# Patient Record
Sex: Female | Born: 1990
Health system: Southern US, Community
[De-identification: ages and names within clinical notes are randomized; demographics above are authoritative.]

## PROBLEM LIST (undated history)

## (undated) DIAGNOSIS — J45909 Unspecified asthma, uncomplicated: Secondary | ICD-10-CM

## (undated) HISTORY — DX: Unspecified asthma, uncomplicated: J45.909

---
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
## 2015-06-18 ENCOUNTER — Ambulatory Visit (INDEPENDENT_AMBULATORY_CARE_PROVIDER_SITE_OTHER): Payer: BLUE CROSS/BLUE SHIELD | Admitting: Family Medicine

## 2015-06-18 ENCOUNTER — Encounter (INDEPENDENT_AMBULATORY_CARE_PROVIDER_SITE_OTHER): Payer: Self-pay

## 2015-06-18 ENCOUNTER — Encounter: Payer: Self-pay | Admitting: Family Medicine

## 2015-06-18 VITALS — BP 131/90 | HR 91 | Temp 98.5°F | Ht 63.0 in | Wt 214.6 lb

## 2015-06-18 DIAGNOSIS — S39012A Strain of muscle, fascia and tendon of lower back, initial encounter: Secondary | ICD-10-CM | POA: Diagnosis not present

## 2015-06-18 MED ORDER — MELOXICAM 15 MG PO TABS: 15.0000 mg | ORAL_TABLET | Freq: Every day | ORAL | Status: DC

## 2015-06-18 NOTE — Progress Notes (Signed)
BP 131/90 mmHg  Pulse 91  Temp(Src) 98.5 F (36.9 C) (Oral)  Ht  (1.6 m)  Wt 214 lb 9.6 oz (97.342 kg)  BMI 38.02 kg/m2   Subjective:    Patient ID: Amanda Lam, female    DOB: 1991/02/08, 25 y.o.   MRN: [id number]  HPI: Amanda Lam is a 25 y.o. female presenting on 06/18/2015 for Back Pain   HPI Back pain Patient comes in today to establish with [place] as a new patient. She is also coming in because she is been having some back pain. Back pain is described as a tightness and pain that is going from her muscles and vitamin down to her mid back. She denies any numbness or weakness going down either arm or leg. 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  Relevant past medical, surgical, family and social history reviewed and updated as indicated. Interim medical history since our last visit reviewed. Allergies and medications reviewed and updated.  Review of Systems  Constitutional: Negative for fever and chills.  HENT: Negative for congestion, ear discharge and ear pain.   Eyes: Negative for redness and visual disturbance.  Respiratory: Negative for chest tightness and shortness of breath.   Cardiovascular: Negative for chest pain and leg swelling.  Genitourinary: Negative for dysuria and difficulty urinating.  Musculoskeletal: Positive for myalgias and back pain. Negative for gait problem.  Skin: Negative for rash.  Neurological: Negative for light-headedness and headaches.  Psychiatric/Behavioral: Negative for behavioral problems and agitation.  All other systems reviewed and are negative.   Per HPI unless specifically indicated above     Medication List       This list is accurate as of: 06/18/15 10:44 AM.  Always use your most recent med list.               cyclobenzaprine 5 MG tablet  Commonly known as:  FLEXERIL  TAKE 1-2 TABLETS BY MOUTH EVERY 8 HOURS AS  NEEDED     meloxicam 15 MG tablet  Commonly known as:  MOBIC  Take 1 tablet (15 mg total) by mouth daily.           Objective:    BP 131/90 mmHg  Pulse 91  Temp(Src) 98.5 F (36.9 C) (Oral)  Ht  (1.6 m)  Wt 214 lb 9.6 oz (97.342 kg)  BMI 38.02 kg/m2  Wt Readings from Last 3 Encounters:  06/18/15 214 lb 9.6 oz (97.342 kg)    Physical Exam  Constitutional: She is oriented to person, place, and time. She appears well-developed and well-nourished. No distress.  Eyes: Conjunctivae and EOM are normal. Pupils are equal, round, and reactive to light.  Neck: Neck supple. No thyromegaly present.  Cardiovascular: Normal rate, regular rhythm, normal heart sounds and intact distal pulses.   No murmur heard. Pulmonary/Chest: Effort normal and breath sounds normal. No respiratory distress. She has no wheezes.  Musculoskeletal: Normal range of motion. She exhibits no edema.       Lumbar back: She exhibits tenderness (Paraspinal tenderness extending from lower cervical down to mid thoracic), pain and spasm. She exhibits normal range of motion, no bony tenderness, no swelling and no laceration.  Lymphadenopathy:    She has no cervical adenopathy.  Neurological: She is alert and oriented to person, place, and time. Coordination normal.  Skin: Skin is warm and dry. No rash noted.  She is not diaphoretic.  Psychiatric: She has a normal mood and affect. Her behavior is normal.  Nursing note and vitals reviewed.   No results found for this or any previous visit.    Assessment & Plan:   Problem List Items Addressed This Visit    None    Visit Diagnoses    Back strain, initial encounter    -  Primary    Continue Flexeril, uses Mobic, use stretching and tennis ball to massage out the tight muscles    Relevant Medications    meloxicam (MOBIC) 15 MG tablet        Follow up plan: Return in about 4 weeks (around 07/16/2015), or if symptoms worsen or fail to improve, for  WWE/PAP.  Counseling provided for all of the vaccine components No orders of the defined types were placed in this encounter.    Arville CareJoshua Venancio Chenier, MD [facility] 06/18/2015, 10:44 AM

## 2015-07-15 ENCOUNTER — Other Ambulatory Visit: Payer: Self-pay | Admitting: Family Medicine

## 2015-07-28 ENCOUNTER — Encounter: Payer: Self-pay | Admitting: Nurse Practitioner

## 2015-07-28 ENCOUNTER — Ambulatory Visit (INDEPENDENT_AMBULATORY_CARE_PROVIDER_SITE_OTHER): Payer: BLUE CROSS/BLUE SHIELD | Admitting: Nurse Practitioner

## 2015-07-28 VITALS — BP 126/88 | HR 116 | Temp 98.1°F | Ht 63.0 in | Wt 216.0 lb

## 2015-07-28 DIAGNOSIS — Z23 Encounter for immunization: Secondary | ICD-10-CM | POA: Diagnosis not present

## 2015-07-28 DIAGNOSIS — Z0000 Encounter for general adult medical examination without abnormal findings: Secondary | ICD-10-CM | POA: Diagnosis not present

## 2015-07-28 DIAGNOSIS — Z01419 Encounter for gynecological examination (general) (routine) without abnormal findings: Secondary | ICD-10-CM

## 2015-07-28 LAB — URINALYSIS, COMPLETE
BILIRUBIN UA: NEGATIVE
Glucose, UA: NEGATIVE
Ketones, UA: NEGATIVE
Leukocytes, UA: NEGATIVE
Nitrite, UA: NEGATIVE
PH UA: 6 (ref 5.0–7.5)
Protein, UA: NEGATIVE
RBC UA: NEGATIVE
Specific Gravity, UA: >1.03 — ABNORMAL HIGH (ref 1.005–1.030)
UUROB: 0.2 mg/dL (ref 0.2–1.0)

## 2015-07-28 LAB — MICROSCOPIC EXAMINATION

## 2015-07-28 NOTE — Progress Notes (Signed)
   Subjective:    Patient ID: Amanda Lam, female    DOB: 09/27/1990, 25 y.o.   MRN: [id number]  HPI Patient comes in today for PAP and pelvic exam- this is her first female exam she is not currently  sexually active. LMP- 07/15/15- normal. Patient has no medical problems and is on no meds.    Review of Systems  Constitutional: Negative.   HENT: Negative.   Respiratory: Negative.   Cardiovascular: Negative.   Gastrointestinal: Negative.   Genitourinary: Negative.   Neurological: Negative.   Psychiatric/Behavioral: Negative.   All other systems reviewed and are negative.      Objective:   Physical Exam  Constitutional: She is oriented to person, place, and time. She appears well-developed and well-nourished.  HENT:  Head: Normocephalic.  Right Ear: Hearing, tympanic membrane, external ear and ear canal normal.  Left Ear: Hearing, tympanic membrane, external ear and ear canal normal.  Nose: Nose normal.  Mouth/Throat: Uvula is midline and oropharynx is clear and moist.  Eyes: Conjunctivae and EOM are normal. Pupils are equal, round, and reactive to light.  Neck: Normal range of motion and full passive range of motion without pain. Neck supple. No JVD present. Carotid bruit is not present. No thyroid mass and no thyromegaly present.  Cardiovascular: Normal rate, normal heart sounds and intact distal pulses.   No murmur heard. Pulmonary/Chest: Effort normal and breath sounds normal. Right breast exhibits no inverted nipple, no mass, no nipple discharge, no skin change and no tenderness. Left breast exhibits no inverted nipple, no mass, no nipple discharge, no skin change and no tenderness.  Abdominal: Soft. Bowel sounds are normal. She exhibits no mass. There is no tenderness.  Genitourinary: Vagina normal and uterus normal. No breast swelling, tenderness, discharge or bleeding. No vaginal discharge found.  Cervix nonparous and pink. bimanual exam-No adnexal masses or tenderness.   Musculoskeletal: Normal range of motion.  Lymphadenopathy:    She has no cervical adenopathy.  Neurological: She is alert and oriented to person, place, and time.  Skin: Skin is warm and dry.  Psychiatric: She has a normal mood and affect. Her behavior is normal. Judgment and thought content normal.    BP 126/88 mmHg  Pulse 116  Temp(Src) 98.1 F (36.7 C) (Oral)  Ht '5\' 3"'$  (1.6 m)  Wt 216 lb (97.977 kg)  BMI 38.27 kg/m2      Assessment & Plan:  1. Annual physical exam - Urinalysis, Complete - CBC with Differential/Platelet - BMP8+EGFR - STD Screen (8)  2. Encounter for routine gynecological examination - Pap IG, CT/NG w/ reflex HPV when ASC-U    Labs pending Health maintenance reviewed Diet and exercise encouraged Continue all meds Follow up  In 1 year  [place], FNP

## 2015-07-28 NOTE — Patient Instructions (Signed)
Pap Test WHY AM I HAVING THIS TEST? A pap test is sometimes called a pap smear. It is a screening test that is used to check for signs of cancer of the vagina, cervix, and uterus. The test can also identify the presence of infection or precancerous changes. Your health care provider will likely recommend you have this test done on a regular basis. This test may be done:  Every 3 years, starting at age 25.  Every 5 years, in combination with testing for the presence of human papillomavirus (HPV).  More or less often depending on other medical conditions.  WHAT KIND OF SAMPLE IS TAKEN? Using a small cotton swab, plastic spatula, or brush, your health care provider will collect a sample of cells from the surface of your cervix. Your cervix is the opening to your uterus, also called a womb. Secretions from the cervix and vagina may also be collected. HOW DO I PREPARE FOR THE TEST?  Be aware of where you are in your menstrual cycle. You may be asked to reschedule the test if you are menstruating on the day of the test.  You may need to reschedule if you have a known vaginal infection on the day of the test.  You may be asked to avoid douching or taking a bath the day before or the day of the test.  Some medicines can cause abnormal test results, such as digitalis and tetracycline. Talk with your health care provider before your test if you take one of these medicines. WHAT DO THE RESULTS MEAN? Abnormal test results may indicate a number of health conditions. These may include:  Cancer. Although pap test results cannot be used to diagnose cancer of the cervix, vagina, or uterus, they may suggest the possibility of cancer. Further tests would be required to determine if cancer is present.  Sexually transmitted disease.  Fungal infection.  Parasite infection.  Herpes infection.  A condition causing or contributing to infertility. It is your responsibility to obtain your test results. Ask  the lab or department performing the test when and how you will get your results. Contact your health care provider to discuss any questions you have about your results.   This information is not intended to replace advice given to you by your health care provider. Make sure you discuss any questions you have with your health care provider.   Document Released: 04/30/2002 Document Revised: 02/28/2014 Document Reviewed: 07/01/2013 Elsevier Interactive Patient Education 2016 [employer]  

## 2015-07-28 NOTE — Addendum Note (Signed)
Addended by: Cleda DaubUCKER, Victormanuel Mclure G on: 07/28/2015 04:17 PM   Modules accepted: Orders, SmartSet

## 2015-07-29 LAB — CBC WITH DIFFERENTIAL/PLATELET
BASOS: 1 %
Basophils Absolute: 0.1 10*3/uL (ref 0.0–0.2)
EOS (ABSOLUTE): 0.1 10*3/uL (ref 0.0–0.4)
Eos: 1 %
HEMOGLOBIN: 13.7 g/dL (ref 11.1–15.9)
Hematocrit: 39.8 % (ref 34.0–46.6)
IMMATURE GRANS (ABS): 0 10*3/uL (ref 0.0–0.1)
Immature Granulocytes: 0 %
LYMPHS ABS: 2.4 10*3/uL (ref 0.7–3.1)
LYMPHS: 29 %
MCH: 27.6 pg (ref 26.6–33.0)
MCHC: 34.4 g/dL (ref 31.5–35.7)
MCV: 80 fL (ref 79–97)
MONOCYTES: 5 %
Monocytes Absolute: 0.5 10*3/uL (ref 0.1–0.9)
NEUTROS ABS: 5.4 10*3/uL (ref 1.4–7.0)
Neutrophils: 64 %
Platelets: 363 10*3/uL (ref 150–379)
RBC: 4.97 x10E6/uL (ref 3.77–5.28)
RDW: 14 % (ref 12.3–15.4)
WBC: 8.4 10*3/uL (ref 3.4–10.8)

## 2015-07-29 LAB — BMP8+EGFR
BUN / CREAT RATIO: 20 (ref 9–23)
BUN: 13 mg/dL (ref 6–20)
CO2: 17 mmol/L — ABNORMAL LOW (ref 18–29)
CREATININE: 0.64 mg/dL (ref 0.57–1.00)
Calcium: 9.6 mg/dL (ref 8.7–10.2)
Chloride: 104 mmol/L (ref 96–106)
GFR, EST AFRICAN AMERICAN: 143 mL/min/{1.73_m2} (ref >59)
GFR, EST NON AFRICAN AMERICAN: 124 mL/min/{1.73_m2} (ref >59)
Glucose: 103 mg/dL — ABNORMAL HIGH (ref 65–99)
Potassium: 4.1 mmol/L (ref 3.5–5.2)
Sodium: 144 mmol/L (ref 134–144)

## 2015-07-29 LAB — STD SCREEN (8)
HIV SCREEN 4TH GENERATION: NONREACTIVE
HSV 2 Glycoprotein G Ab, IgG: <0.91 index (ref 0.00–0.90)
Hep A IgM: NEGATIVE
Hep B C IgM: NEGATIVE
Hepatitis B Surface Ag: NEGATIVE
RPR: NONREACTIVE

## 2015-07-30 LAB — PAP IG, CT-NG, RFX HPV ASCU
Chlamydia, Nuc. Acid Amp: NEGATIVE
GONOCOCCUS BY NUCLEIC ACID AMP: NEGATIVE
PAP Smear Comment: 0

## 2015-08-05 ENCOUNTER — Encounter: Payer: Self-pay | Admitting: Family Medicine

## 2015-08-05 ENCOUNTER — Ambulatory Visit (INDEPENDENT_AMBULATORY_CARE_PROVIDER_SITE_OTHER): Payer: BLUE CROSS/BLUE SHIELD | Admitting: Family Medicine

## 2015-08-05 VITALS — BP 134/86 | HR 88 | Temp 98.9°F | Ht 63.0 in | Wt 217.6 lb

## 2015-08-05 DIAGNOSIS — F410 Panic disorder [episodic paroxysmal anxiety] without agoraphobia: Secondary | ICD-10-CM | POA: Insufficient documentation

## 2015-08-05 DIAGNOSIS — R7301 Impaired fasting glucose: Secondary | ICD-10-CM | POA: Diagnosis not present

## 2015-08-05 DIAGNOSIS — F418 Other specified anxiety disorders: Secondary | ICD-10-CM

## 2015-08-05 DIAGNOSIS — F329 Major depressive disorder, single episode, unspecified: Secondary | ICD-10-CM

## 2015-08-05 DIAGNOSIS — F419 Anxiety disorder, unspecified: Secondary | ICD-10-CM

## 2015-08-05 DIAGNOSIS — F32A Depression, unspecified: Secondary | ICD-10-CM

## 2015-08-05 MED ORDER — HYDROXYZINE HCL 50 MG PO TABS: 50.0000 mg | ORAL_TABLET | Freq: Three times a day (TID) | ORAL | Status: DC | PRN

## 2015-08-05 MED ORDER — ESCITALOPRAM OXALATE 10 MG PO TABS: 10.0000 mg | ORAL_TABLET | Freq: Every day | ORAL | Status: DC

## 2015-08-05 NOTE — Progress Notes (Signed)
BP 134/86 mmHg  Pulse 88  Temp(Src) 98.9 F (37.2 C) (Oral)  Ht  (1.6 m)  Wt 217 lb 9.6 oz (98.703 kg)  BMI 38.56 kg/m2   Subjective:    Patient ID: Amanda Lam, female    DOB: Feb 17, 1991, 25 y.o.   MRN: [id number]  HPI: Amanda Lam is a 25 y.o. female presenting on 08/05/2015 for Headache   HPI Flushing and headache and palpitations Over the past week patient has been having almost daily episodes of flushing and headaches and palpitations and just feeling off. Patient has been feeling this for the past week. Her headaches are described as bilateral temporal headaches which then sometimes become more intense and she does sometimes have photophobia associated with them. She will lay down to rest and all of the symptoms will improve. She does admit that she had a history of depression and anxiety and she has been having a very high stress and anxiety level. She is also been feeling depressed sometimes more recently. She denies any suicidal ideations or thoughts of hurting herself. She says she is sleeping well at night and has occasional waking up in the middle of night. She denies any chest pain or shortness of breath.  Relevant past medical, surgical, family and social history reviewed and updated as indicated. Interim medical history since our last visit reviewed. Allergies and medications reviewed and updated.  Review of Systems  Constitutional: Negative for fever and chills.  HENT: Negative for congestion, ear discharge and ear pain.   Eyes: Negative for redness and visual disturbance.  Respiratory: Negative for chest tightness and shortness of breath.   Cardiovascular: Negative for chest pain and leg swelling.  Genitourinary: Negative for dysuria and difficulty urinating.  Musculoskeletal: Negative for back pain and gait problem.  Skin: Negative for rash.  Neurological: Positive for light-headedness and headaches. Negative for dizziness, speech difficulty, weakness and  numbness.  Psychiatric/Behavioral: Positive for sleep disturbance and dysphoric mood. Negative for suicidal ideas, behavioral problems, self-injury and agitation. The patient is nervous/anxious.   All other systems reviewed and are negative.   Per HPI unless specifically indicated above     Medication List       This list is accurate as of: 08/05/15 11:36 AM.  Always use your most recent med list.               cyclobenzaprine 10 MG tablet  Commonly known as:  FLEXERIL  Take 10 mg by mouth as needed for muscle spasms.     escitalopram 10 MG tablet  Commonly known as:  LEXAPRO  Take 1 tablet (10 mg total) by mouth daily.           Objective:    BP 134/86 mmHg  Pulse 88  Temp(Src) 98.9 F (37.2 C) (Oral)  Ht  (1.6 m)  Wt 217 lb 9.6 oz (98.703 kg)  BMI 38.56 kg/m2  Wt Readings from Last 3 Encounters:  08/05/15 217 lb 9.6 oz (98.703 kg)  07/28/15 216 lb (97.977 kg)  06/18/15 214 lb 9.6 oz (97.342 kg)    Physical Exam  Constitutional: She is oriented to person, place, and time. She appears well-developed and well-nourished. No distress.  Eyes: Conjunctivae and EOM are normal. Pupils are equal, round, and reactive to light.  Neck: Neck supple. No thyromegaly present.  Cardiovascular: Normal rate, regular rhythm, normal heart sounds and intact distal pulses.   No murmur heard. Pulmonary/Chest: Effort normal and breath sounds normal.  No respiratory distress. She has no wheezes. She has no rales.  Musculoskeletal: Normal range of motion. She exhibits no edema or tenderness.  Lymphadenopathy:    She has no cervical adenopathy.  Neurological: She is alert and oriented to person, place, and time. Coordination normal.  Skin: Skin is warm and dry. No rash noted. She is not diaphoretic.  Psychiatric: Her behavior is normal. Judgment and thought content normal. Her mood appears anxious. She exhibits a depressed mood. She expresses no suicidal ideation. She expresses no  suicidal plans.  Nursing note and vitals reviewed.     Assessment & Plan:   Problem List Items Addressed This Visit      Other   Anxiety and depression   Relevant Medications   escitalopram (LEXAPRO) 10 MG tablet   hydrOXYzine (ATARAX/VISTARIL) 50 MG tablet   Other Relevant Orders   TSH   Panic attacks   Relevant Medications   escitalopram (LEXAPRO) 10 MG tablet   hydrOXYzine (ATARAX/VISTARIL) 50 MG tablet   Other Relevant Orders   TSH    Other Visit Diagnoses    Elevated fasting glucose    -  Primary    Relevant Orders    Bayer DCA Hb A1c Waived        Follow up plan: Return in about 4 weeks (around 09/02/2015), or if symptoms worsen or fail to improve, for Recheck anxiety and depression and panic attacks.  Counseling provided for all of the vaccine components Orders Placed This Encounter  Procedures  . Bayer DCA Hb A1c Waived  . TSH    Arville CareJoshua Dettinger, MD [facility] 08/05/2015, 11:36 AM

## 2015-08-06 LAB — TSH: TSH: 1.8 u[IU]/mL (ref 0.450–4.500)

## 2015-08-06 LAB — BAYER DCA HB A1C WAIVED: HB A1C: 5.4 % (ref <7.0)

## 2015-08-19 ENCOUNTER — Ambulatory Visit (INDEPENDENT_AMBULATORY_CARE_PROVIDER_SITE_OTHER): Payer: BLUE CROSS/BLUE SHIELD | Admitting: Family Medicine

## 2015-08-19 ENCOUNTER — Encounter: Payer: Self-pay | Admitting: Family Medicine

## 2015-08-19 VITALS — BP 127/82 | HR 70 | Temp 98.6°F | Ht 63.0 in | Wt 215.4 lb

## 2015-08-19 DIAGNOSIS — L918 Other hypertrophic disorders of the skin: Secondary | ICD-10-CM

## 2015-08-19 NOTE — Progress Notes (Signed)
BP 127/82 mmHg  Pulse 70  Temp(Src) 98.6 F (37 C) (Oral)  Ht 5\' 3"  (1.6 m)  Wt 215 lb 6.4 oz (97.705 kg)  BMI 38.17 kg/m2  LMP 08/19/2015 (Exact Date)   Subjective:    Patient ID: Amanda Lam, female    DOB: December 23, 1990, 25 y.o.   MRN: [id number]  HPI: Amanda Lam is a 25 y.o. female presenting on 08/19/2015 for Skin tag removal   HPI Skin tag left neck Patient has an irritated skin tag on the left side of her neck near her shirt line. It has been increasing in size over the past few years and has been more and more irritated. Sometimes she will snag it and will bleed as well. She denies any drainage or redness or warmth from the site.  Relevant past medical, surgical, family and social history reviewed and updated as indicated. Interim medical history since our last visit reviewed. Allergies and medications reviewed and updated.  Review of Systems  Constitutional: Negative for fever and chills.  HENT: Negative for congestion, ear discharge and ear pain.   Eyes: Negative for redness and visual disturbance.  Respiratory: Negative for chest tightness and shortness of breath.   Cardiovascular: Negative for chest pain and leg swelling.  Genitourinary: Negative for dysuria and difficulty urinating.  Musculoskeletal: Negative for back pain and gait problem.  Skin: Negative for rash.  Neurological: Negative for light-headedness and headaches.  Psychiatric/Behavioral: Negative for behavioral problems and agitation.  All other systems reviewed and are negative.   Per HPI unless specifically indicated above     Medication List       This list is accurate as of: 08/19/15  4:30 PM.  Always use your most recent med list.               cyclobenzaprine 10 MG tablet  Commonly known as:  FLEXERIL  Take 10 mg by mouth as needed for muscle spasms. Reported on 08/19/2015     escitalopram 10 MG tablet  Commonly known as:  LEXAPRO  Take 1 tablet (10 mg total) by mouth daily.     hydrOXYzine 50 MG tablet  Commonly known as:  ATARAX/VISTARIL  Take 1 tablet (50 mg total) by mouth 3 (three) times daily as needed.           Objective:    BP 127/82 mmHg  Pulse 70  Temp(Src) 98.6 F (37 C) (Oral)  Ht 5\' 3"  (1.6 m)  Wt 215 lb 6.4 oz (97.705 kg)  BMI 38.17 kg/m2  LMP 08/19/2015 (Exact Date)  Wt Readings from Last 3 Encounters:  08/19/15 215 lb 6.4 oz (97.705 kg)  08/05/15 217 lb 9.6 oz (98.703 kg)  07/28/15 216 lb (97.977 kg)    Physical Exam  Constitutional: She is oriented to person, place, and time. She appears well-developed and well-nourished. No distress.  Eyes: Conjunctivae and EOM are normal. Pupils are equal, round, and reactive to light.  Cardiovascular: Normal rate, regular rhythm, normal heart sounds and intact distal pulses.   No murmur heard. Pulmonary/Chest: Effort normal and breath sounds normal. No respiratory distress. She has no wheezes.  Musculoskeletal: Normal range of motion. She exhibits no edema or tenderness.  Neurological: She is alert and oriented to person, place, and time. Coordination normal.  Skin: Skin is warm and dry. Lesion (0.1 cm skin tag on left base of her neck. No signs of erythema or warmth or drainage.) noted. No rash noted. She is not diaphoretic.  Psychiatric: She has a normal mood and affect. Her behavior is normal.  Nursing note and vitals reviewed.   Skin tag removal: Topical Betadine was used for prep. Forceps were used to elevate and iris scissors used to cut at the stalk. Minimal to no bleeding. Patient tolerated well. Applied simple bandage after    Assessment & Plan:   Problem List Items Addressed This Visit    None    Visit Diagnoses    Skin tag    -  Primary    Left neck, irritated skin tag because of shirt line. Removed without incident        Follow up plan: Return if symptoms worsen or fail to improve.  Counseling provided for all of the vaccine components No orders of the defined types  were placed in this encounter.    Arville CareJoshua Julietta Batterman, MD [facility] 08/19/2015, 4:30 PM

## 2015-09-02 ENCOUNTER — Encounter: Payer: Self-pay | Admitting: Family Medicine

## 2015-09-02 ENCOUNTER — Ambulatory Visit (INDEPENDENT_AMBULATORY_CARE_PROVIDER_SITE_OTHER): Payer: BLUE CROSS/BLUE SHIELD | Admitting: Family Medicine

## 2015-09-02 VITALS — BP 121/82 | HR 77 | Temp 98.1°F | Ht 63.0 in | Wt 213.0 lb

## 2015-09-02 DIAGNOSIS — F419 Anxiety disorder, unspecified: Principal | ICD-10-CM

## 2015-09-02 DIAGNOSIS — F410 Panic disorder [episodic paroxysmal anxiety] without agoraphobia: Secondary | ICD-10-CM | POA: Diagnosis not present

## 2015-09-02 DIAGNOSIS — F418 Other specified anxiety disorders: Secondary | ICD-10-CM | POA: Diagnosis not present

## 2015-09-02 DIAGNOSIS — F32A Depression, unspecified: Secondary | ICD-10-CM

## 2015-09-02 DIAGNOSIS — F329 Major depressive disorder, single episode, unspecified: Secondary | ICD-10-CM

## 2015-09-02 MED ORDER — ESCITALOPRAM OXALATE 10 MG PO TABS: 10.0000 mg | ORAL_TABLET | Freq: Every day | ORAL | Status: DC

## 2015-09-02 NOTE — Progress Notes (Signed)
BP 121/82 mmHg  Pulse 77  Temp(Src) 98.1 F (36.7 C) (Oral)  Ht 5\' 3"  (1.6 m)  Wt 213 lb (96.616 kg)  BMI 37.74 kg/m2  LMP 08/19/2015 (Exact Date)   Subjective:    Patient ID: Amanda Lam, female    DOB: May 31, 1990, 25 y.o.   MRN: [id number]  HPI: Amanda Lam is a 25 y.o. female presenting on 09/02/2015 for Depression   HPI Anxiety and depression and panic attacks Patient feels like she is doing very well on her medications and is very rarely having panic attacks now and when she does have a mammogram or controllable. She is going through a breakup recently so she is having a lot of sadness from that but not anything more than the normal breakup at this point. She denies any suicidal ideations or thoughts of hurting herself.  Relevant past medical, surgical, family and social history reviewed and updated as indicated. Interim medical history since our last visit reviewed. Allergies and medications reviewed and updated.  Review of Systems  Constitutional: Negative for fever and chills.  HENT: Negative for congestion, ear discharge and ear pain.   Eyes: Negative for redness and visual disturbance.  Respiratory: Negative for chest tightness and shortness of breath.   Cardiovascular: Negative for chest pain and leg swelling.  Genitourinary: Negative for dysuria and difficulty urinating.  Musculoskeletal: Negative for back pain and gait problem.  Skin: Negative for rash.  Neurological: Negative for dizziness, light-headedness and headaches.  Psychiatric/Behavioral: Positive for dysphoric mood. Negative for suicidal ideas, behavioral problems, sleep disturbance, self-injury and agitation. The patient is nervous/anxious.   All other systems reviewed and are negative.   Per HPI unless specifically indicated above     Medication List       This list is accurate as of: 09/02/15  1:31 PM.  Always use your most recent med list.               cyclobenzaprine 10 MG tablet    Commonly known as:  FLEXERIL  Take 10 mg by mouth as needed for muscle spasms. Reported on 09/02/2015     escitalopram 10 MG tablet  Commonly known as:  LEXAPRO  Take 1 tablet (10 mg total) by mouth daily.     hydrOXYzine 50 MG tablet  Commonly known as:  ATARAX/VISTARIL  Take 1 tablet (50 mg total) by mouth 3 (three) times daily as needed.           Objective:    BP 121/82 mmHg  Pulse 77  Temp(Src) 98.1 F (36.7 C) (Oral)  Ht 5\' 3"  (1.6 m)  Wt 213 lb (96.616 kg)  BMI 37.74 kg/m2  LMP 08/19/2015 (Exact Date)  Wt Readings from Last 3 Encounters:  09/02/15 213 lb (96.616 kg)  08/19/15 215 lb 6.4 oz (97.705 kg)  08/05/15 217 lb 9.6 oz (98.703 kg)    Physical Exam  Constitutional: She is oriented to person, place, and time. She appears well-developed and well-nourished. No distress.  Eyes: Conjunctivae and EOM are normal. Pupils are equal, round, and reactive to light.  Neck: Neck supple. No thyromegaly present.  Cardiovascular: Normal rate, regular rhythm, normal heart sounds and intact distal pulses.   No murmur heard. Pulmonary/Chest: Effort normal and breath sounds normal. No respiratory distress. She has no wheezes.  Musculoskeletal: Normal range of motion. She exhibits no edema or tenderness.  Lymphadenopathy:    She has no cervical adenopathy.  Neurological: She is alert and oriented to  person, place, and time. Coordination normal.  Skin: Skin is warm and dry. No rash noted. She is not diaphoretic.  Psychiatric: Her behavior is normal. Thought content normal. Her mood appears anxious. She exhibits a depressed mood. She expresses no suicidal ideation. She expresses no suicidal plans.  Nursing note and vitals reviewed.   Results for orders placed or performed in visit on 08/05/15  Bayer DCA Hb A1c Waived  Result Value Ref Range   Bayer DCA Hb A1c Waived 5.4 <7.0 %  TSH  Result Value Ref Range   TSH 1.800 0.450 - 4.500 uIU/mL      Assessment & Plan:        Problem List Items Addressed This Visit      Other   Anxiety and depression - Primary   Relevant Medications   escitalopram (LEXAPRO) 10 MG tablet   Panic attacks   Relevant Medications   escitalopram (LEXAPRO) 10 MG tablet       Follow up plan: Return in about 3 months (around 12/03/2015), or if symptoms worsen or fail to improve, for Anxiety recheck.  Counseling provided for all of the vaccine components No orders of the defined types were placed in this encounter.    Arville Care, MD [facility] 09/02/2015, 1:31 PM

## 2015-09-24 ENCOUNTER — Encounter: Payer: Self-pay | Admitting: *Deleted

## 2015-10-19 ENCOUNTER — Telehealth: Payer: Self-pay | Admitting: Family Medicine

## 2015-10-19 NOTE — Telephone Encounter (Signed)
Appt made

## 2015-10-21 ENCOUNTER — Ambulatory Visit: Payer: BLUE CROSS/BLUE SHIELD | Admitting: Family Medicine

## 2015-10-22 ENCOUNTER — Encounter: Payer: Self-pay | Admitting: Family Medicine

## 2015-12-03 ENCOUNTER — Ambulatory Visit: Payer: BLUE CROSS/BLUE SHIELD | Admitting: Family Medicine

## 2015-12-04 ENCOUNTER — Encounter: Payer: Self-pay | Admitting: Family Medicine

## 2016-02-26 ENCOUNTER — Ambulatory Visit: Payer: BLUE CROSS/BLUE SHIELD | Admitting: Family Medicine

## 2016-02-26 ENCOUNTER — Ambulatory Visit (INDEPENDENT_AMBULATORY_CARE_PROVIDER_SITE_OTHER): Payer: BLUE CROSS/BLUE SHIELD | Admitting: Family Medicine

## 2016-02-26 ENCOUNTER — Encounter: Payer: Self-pay | Admitting: Family Medicine

## 2016-02-26 VITALS — BP 126/78 | HR 88 | Temp 98.2°F | Ht 63.0 in | Wt 222.0 lb

## 2016-02-26 DIAGNOSIS — G5603 Carpal tunnel syndrome, bilateral upper limbs: Secondary | ICD-10-CM | POA: Diagnosis not present

## 2016-02-26 DIAGNOSIS — S39012A Strain of muscle, fascia and tendon of lower back, initial encounter: Secondary | ICD-10-CM

## 2016-02-26 MED ORDER — CYCLOBENZAPRINE HCL 10 MG PO TABS: 10.0000 mg | ORAL_TABLET | ORAL | 1 refills | Status: DC | PRN

## 2016-02-26 NOTE — Progress Notes (Signed)
BP 126/78   Pulse 88   Temp 98.2 F (36.8 C) (Oral)   Ht 5\' 3"  (1.6 m)   Wt 222 lb (100.7 kg)   LMP 02/12/2016 (Approximate)   BMI 39.33 kg/m    Subjective:    Patient ID: Amanda Lam, female    DOB: [birth year], 26 y.o.   MRN: [id number]  HPI: Amanda Lam is a 26 y.o. female presenting on 02/26/2016 for Back Pain (in between shoulder blades x 2-3 weeks, taking Aleve and using muscle rub) and Burning, numbness and tingling in right hand (started after she began lifting heavy cans of cotton at work)   HPI Back pain Patient is coming in today for back pain up in the thorax region and she does a lot worse because she's been working a job where she has to do a lot of lifting and twisting and it just feels tight. She says the low-grade tightness not a sharp pain that goes or fevers or chills or overlying skin changes. between her scapula on her upper back. She denies any numbness or weakness going down either arm and denies any pain going up or down her back. She does have numbness in both hands because she's doing a lot of movement with her hands but not in the arms. This is been increased over the past 2 weeks  Bilateral hand numbness and tingling Patient has bilateral hand numbness and tingling and cannot pinpoint wounds on the medial side or lateral side over the whole hand but says that she gets it and will shoot up to her hands especially when she is doing a lot of lifting and moving and twisting and movements with her hands. She denies any grip loss or fevers or chills or overlying skin changes. This is been increased over the past 2 weeks  Relevant past medical, surgical, family and social history reviewed and updated as indicated. Interim medical history since our last visit reviewed. Allergies and medications reviewed and updated.  Review of Systems  Constitutional: Negative for chills and fever.  Respiratory: Negative for chest tightness and shortness of breath.     Cardiovascular: Negative for chest pain and leg swelling.  Genitourinary: Negative for difficulty urinating and dysuria.  Musculoskeletal: Positive for back pain and myalgias. Negative for gait problem.  Skin: Negative for rash.  Neurological: Positive for numbness. Negative for weakness, light-headedness and headaches.  Psychiatric/Behavioral: Negative for agitation and behavioral problems.  All other systems reviewed and are negative.   Per HPI unless specifically indicated above     Objective:    BP 126/78   Pulse 88   Temp 98.2 F (36.8 C) (Oral)   Ht 5\' 3"  (1.6 m)   Wt 222 lb (100.7 kg)   LMP 02/12/2016 (Approximate)   BMI 39.33 kg/m   Wt Readings from Last 3 Encounters:  02/26/16 222 lb (100.7 kg)  09/02/15 213 lb (96.6 kg)  08/19/15 215 lb 6.4 oz (97.7 kg)    Physical Exam  Constitutional: She is oriented to person, place, and time. She appears well-developed and well-nourished. No distress.  Eyes: Conjunctivae are normal.  Cardiovascular: Normal rate, regular rhythm, normal heart sounds and intact distal pulses.   No murmur heard. Pulmonary/Chest: Effort normal and breath sounds normal. No respiratory distress. She has no wheezes.  Musculoskeletal: Normal range of motion. She exhibits no edema. Tenderness: Unable to elicit any tenderness on exam in her upper back.       Right wrist: She  exhibits tenderness (Positive Tinel sign causing numbness to shoot into her hand).       Left wrist: She exhibits tenderness (Positive Tinel sign causing numbness to shoot into her hand).  Neurological: She is alert and oriented to person, place, and time. Coordination normal.  Skin: Skin is warm and dry. No rash noted. She is not diaphoretic.  Psychiatric: She has a normal mood and affect. Her behavior is normal.  Nursing note and vitals reviewed.     Assessment & Plan:   Problem List Items Addressed This Visit    None    Visit Diagnoses    Bilateral carpal tunnel syndrome     -  Primary   Patient wants to continue trying the bracing and anti-inflammatories for now, may need injections in the future but does not want one today   Relevant Medications   cyclobenzaprine (FLEXERIL) 10 MG tablet   Back strain, initial encounter       Patient does a lot of lifting for work and is just been feeling some more sore over the past couple weeks, will give Flexeril and recommended stretching   Relevant Medications   cyclobenzaprine (FLEXERIL) 10 MG tablet       Follow up plan: Return if symptoms worsen or fail to improve.  Counseling provided for all of the vaccine components No orders of the defined types were placed in this encounter.   Arville Care, MD [facility] 02/26/2016, 10:58 AM

## 2016-06-29 DIAGNOSIS — R51 Headache: Secondary | ICD-10-CM | POA: Diagnosis not present

## 2018-04-23 ENCOUNTER — Ambulatory Visit: Payer: BLUE CROSS/BLUE SHIELD | Admitting: Family Medicine

## 2018-04-26 ENCOUNTER — Encounter: Payer: Self-pay | Admitting: Family Medicine

## 2018-05-04 ENCOUNTER — Encounter: Payer: BLUE CROSS/BLUE SHIELD | Admitting: Family Medicine

## 2018-05-24 ENCOUNTER — Other Ambulatory Visit: Payer: Self-pay

## 2018-05-24 ENCOUNTER — Ambulatory Visit: Payer: BLUE CROSS/BLUE SHIELD | Admitting: Family Medicine

## 2018-05-24 ENCOUNTER — Ambulatory Visit (INDEPENDENT_AMBULATORY_CARE_PROVIDER_SITE_OTHER): Payer: BLUE CROSS/BLUE SHIELD

## 2018-05-24 ENCOUNTER — Encounter: Payer: Self-pay | Admitting: Family Medicine

## 2018-05-24 VITALS — BP 123/74 | HR 80 | Temp 97.2°F | Ht 63.0 in | Wt 198.2 lb

## 2018-05-24 DIAGNOSIS — M5442 Lumbago with sciatica, left side: Secondary | ICD-10-CM

## 2018-05-24 DIAGNOSIS — R109 Unspecified abdominal pain: Secondary | ICD-10-CM

## 2018-05-24 DIAGNOSIS — K219 Gastro-esophageal reflux disease without esophagitis: Secondary | ICD-10-CM | POA: Insufficient documentation

## 2018-05-24 DIAGNOSIS — R3129 Other microscopic hematuria: Secondary | ICD-10-CM

## 2018-05-24 DIAGNOSIS — M5441 Lumbago with sciatica, right side: Secondary | ICD-10-CM

## 2018-05-24 DIAGNOSIS — R1030 Lower abdominal pain, unspecified: Secondary | ICD-10-CM | POA: Diagnosis not present

## 2018-05-24 LAB — URINALYSIS, COMPLETE
Bilirubin, UA: NEGATIVE
Glucose, UA: NEGATIVE
Ketones, UA: NEGATIVE
Leukocytes,UA: NEGATIVE
Nitrite, UA: NEGATIVE
Protein,UA: NEGATIVE
Specific Gravity, UA: 1.025 (ref 1.005–1.030)
Urobilinogen, Ur: 0.2 mg/dL (ref 0.2–1.0)
pH, UA: 5.5 (ref 5.0–7.5)

## 2018-05-24 LAB — MICROSCOPIC EXAMINATION: Renal Epithel, UA: NONE SEEN /hpf

## 2018-05-24 LAB — PREGNANCY, URINE: Preg Test, Ur: NEGATIVE

## 2018-05-24 MED ORDER — METHYLPREDNISOLONE ACETATE 40 MG/ML IJ SUSP
40.0000 mg | Freq: Once | INTRAMUSCULAR | Status: AC
  Administered 2018-05-24: 40 mg via INTRAMUSCULAR

## 2018-05-24 MED ORDER — OMEPRAZOLE 20 MG PO CPDR: 20.0000 mg | DELAYED_RELEASE_CAPSULE | Freq: Two times a day (BID) | ORAL | 3 refills | Status: DC

## 2018-05-24 MED ORDER — NAPROXEN 500 MG PO TABS: 500.0000 mg | ORAL_TABLET | Freq: Two times a day (BID) | ORAL | 0 refills | Status: AC

## 2018-05-24 NOTE — Progress Notes (Signed)
Subjective:  Patient ID: Amanda Lam, female    DOB: 07-25-1990, 28 y.o.   MRN: [id number]  Chief Complaint:  Abdominal Pain and Back Pain   HPI: Amanda Lam is a 28 y.o. female presenting on 05/24/2018 for Abdominal Pain and Back Pain  Pt presents today with intermittent epigastric pain and lower abdominal pain. Pt states the epigastric pain has been ongoing for several months, states it is sharp and aching at times. States she can usually control this pain with antiacid chews. States these have not been beneficial over the last week. She states this pain is worse after certain meals and at night, 5/10 at worst. She denies vomiting or diarrhea. She has had nausea and a decrease in bowel movements. She denies rectal bleeding or changes in stool color.  Pt states she also has lower back pain. She reports aching, sharp, shooting midline lower back pain that radiates to both legs. States 6/10 at worst. She states she has not injured herself. No loss of function, weakness, or numbness. No saddle anesthesia or loss of bowel or bladder. States the pain is worse when active and relieved slightly by rest. States she has not tried anything for the pain. She denies urinary or vaginal symptoms. No fever, chills, cough, shortness of breath, chest pain, or fatigue. No recent travel or known sick exposures.   Relevant past medical, surgical, family, and social history reviewed and updated as indicated.  Allergies and medications reviewed and updated.   Past Medical History:  Diagnosis Date  . Asthma     History reviewed. No pertinent surgical history.  Social History   Socioeconomic History  . Marital status: Single    Spouse name: Not on file  . Number of children: Not on file  . Years of education: Not on file  . Highest education level: Not on file  Occupational History  . Not on file  Social Needs  . Financial resource strain: Not on file  . Food insecurity:    Worry: Not on file   Inability: Not on file  . Transportation needs:    Medical: Not on file    Non-medical: Not on file  Tobacco Use  . Smoking status: Former Smoker    Packs/day: 0.50    Years: 7.00    Pack years: 3.50    Types: Cigarettes  . Smokeless tobacco: Never Used  Substance and Sexual Activity  . Alcohol use: No  . Drug use: No  . Sexual activity: Not Currently  Lifestyle  . Physical activity:    Days per week: Not on file    Minutes per session: Not on file  . Stress: Not on file  Relationships  . Social connections:    Talks on phone: Not on file    Gets together: Not on file    Attends religious service: Not on file    Active member of club or organization: Not on file    Attends meetings of clubs or organizations: Not on file    Relationship status: Not on file  . Intimate partner violence:    Fear of current or ex partner: Not on file    Emotionally abused: Not on file    Physically abused: Not on file    Forced sexual activity: Not on file  Other Topics Concern  . Not on file  Social History Narrative  . Not on file    Outpatient Encounter Medications as of 05/24/2018  Medication Sig  .  naproxen (NAPROSYN) 500 MG tablet Take 1 tablet (500 mg total) by mouth 2 (two) times daily with a meal for 14 days.  Marland Kitchen omeprazole (PRILOSEC) 20 MG capsule Take 1 capsule (20 mg total) by mouth 2 (two) times daily before a meal for 30 days.  . [DISCONTINUED] cyclobenzaprine (FLEXERIL) 10 MG tablet Take 1 tablet (10 mg total) by mouth as needed for muscle spasms. Reported on 09/02/2015  . [EXPIRED] methylPREDNISolone acetate (DEPO-MEDROL) injection 40 mg    No facility-administered encounter medications on file as of 05/24/2018.     Allergies  Allergen Reactions  . Penicillins Anaphylaxis    Review of Systems  Constitutional: Negative for activity change, appetite change, chills, fatigue, fever and unexpected weight change.  HENT: Negative for congestion.   Respiratory: Negative for  cough, shortness of breath and wheezing.   Cardiovascular: Negative for chest pain, palpitations and leg swelling.  Gastrointestinal: Positive for abdominal pain (epigastric pain), constipation and nausea. Negative for abdominal distention, anal bleeding, blood in stool, diarrhea, rectal pain and vomiting.  Genitourinary: Negative for decreased urine volume, difficulty urinating, dyspareunia, dysuria, enuresis, flank pain, frequency, genital sores, hematuria, menstrual problem, pelvic pain, urgency, vaginal bleeding, vaginal discharge and vaginal pain.  Musculoskeletal: Positive for back pain. Negative for gait problem, joint swelling, myalgias, neck pain and neck stiffness.  Skin: Negative for color change and rash.  Neurological: Negative for dizziness, weakness, numbness and headaches.  Psychiatric/Behavioral: Negative for confusion.  All other systems reviewed and are negative.       Objective:  BP 123/74   Pulse 80   Temp (!) 97.2 F (36.2 C) (Oral)   Ht  (1.6 m)   Wt 198 lb 3.2 oz (89.9 kg)   BMI 35.11 kg/m    Wt Readings from Last 3 Encounters:  05/24/18 198 lb 3.2 oz (89.9 kg)  02/26/16 222 lb (100.7 kg)  09/02/15 213 lb (96.6 kg)    Physical Exam Vitals signs and nursing note reviewed.  Constitutional:      General: She is not in acute distress.    Appearance: Normal appearance. She is well-developed. She is obese. She is not ill-appearing or toxic-appearing.  HENT:     Head: Normocephalic and atraumatic.     Right Ear: Hearing normal.     Left Ear: Hearing normal.     Nose: Nose normal.     Mouth/Throat:     Lips: Pink.     Mouth: Mucous membranes are moist.     Pharynx: Oropharynx is clear. Uvula midline.  Eyes:     General: Lids are normal.     Extraocular Movements: Extraocular movements intact.     Conjunctiva/sclera: Conjunctivae normal.     Pupils: Pupils are equal, round, and reactive to light.  Neck:     Musculoskeletal: Normal range of motion  and neck supple.     Trachea: Trachea and phonation normal.  Cardiovascular:     Rate and Rhythm: Normal rate and regular rhythm.     Pulses: Normal pulses.     Heart sounds: Normal heart sounds. No murmur. No friction rub. No gallop.   Pulmonary:     Effort: Pulmonary effort is normal. No respiratory distress.     Breath sounds: Normal breath sounds. No wheezing, rhonchi or rales.  Chest:     Chest wall: No tenderness.  Abdominal:     General: Bowel sounds are normal. There is no distension.     Palpations: Abdomen is soft. There is  no mass.     Tenderness: There is no abdominal tenderness. There is no right CVA tenderness, left CVA tenderness, guarding or rebound.     Hernia: No hernia is present.  Musculoskeletal:     Right hip: Normal.     Left hip: Normal.     Thoracic back: Normal.     Lumbar back: She exhibits tenderness and pain. She exhibits normal range of motion, no bony tenderness, no swelling, no edema, no deformity, no laceration, no spasm and normal pulse.     Right lower leg: No edema.     Left lower leg: No edema.     Comments: Positive bilateral straight leg raise test at 35 degrees  Skin:    General: Skin is warm and dry.     Capillary Refill: Capillary refill takes less than 2 seconds.  Neurological:     General: No focal deficit present.     Mental Status: She is alert and oriented to person, place, and time.     Cranial Nerves: No cranial nerve deficit.     Sensory: No sensory deficit.     Motor: No weakness.     Coordination: Coordination normal.     Gait: Gait normal.     Deep Tendon Reflexes: Reflexes normal.  Psychiatric:        Mood and Affect: Mood normal.        Behavior: Behavior normal. Behavior is cooperative.        Thought Content: Thought content normal.        Judgment: Judgment normal.     Results for orders placed or performed in visit on 08/05/15  Bayer DCA Hb A1c Waived  Result Value Ref Range   HB A1C (BAYER DCA - WAIVED) 5.4  <7.0 %  TSH  Result Value Ref Range   TSH 1.800 0.450 - 4.500 uIU/mL     X-Ray: KUB: No acute findings. Preliminary x-ray reading by Kari Baars, FNP-C, [facility].  Urine pregnancy negative  Urinalysis: trace blood, few bacteria, negative leukocytes, negative nitrites.  Pertinent labs & imaging results that were available during my care of the patient were reviewed by me and considered in my medical decision making.  Assessment & Plan:  Glenys was seen today for abdominal pain and back pain.  Diagnoses and all orders for this visit:  Acute midline low back pain with bilateral sciatica Symptomatic care discussed. No red flags present. Conservative management for 4-6 weeks. Report any new or worsening symptoms.  -     Pregnancy, urine -     Urinalysis, Complete -     DG Abd 1 View; Future -     Urine Culture -     naproxen (NAPROSYN) 500 MG tablet; Take 1 tablet (500 mg total) by mouth 2 (two) times daily with a meal for 14 days. -     methylPREDNISolone acetate (DEPO-MEDROL) injection 40 mg  Other microscopic hematuria Culture pending, will treat if warranted.  -     DG Abd 1 View; Future -     Urine Culture  Gastroesophageal reflux disease without esophagitis Will initiate daily therapy, omeprazole. Avoid spicy, greasy foods. Report any new or worsening symptoms.  -     omeprazole (PRILOSEC) 20 MG capsule; Take 1 capsule (20 mg total) by mouth 2 (two) times daily before a meal for 30 days.     Continue all other maintenance medications.  Follow up plan: Return in about 6 weeks (around 07/05/2018), or if symptoms  worsen or fail to improve, for GERD, back pain.  Educational handout given for GERD and low back pain  The above assessment and management plan was discussed with the patient. The patient verbalized understanding of and has agreed to the management plan. Patient is aware to call the clinic if symptoms persist or worsen. Patient is aware when to return to the clinic  for a follow-up visit. Patient educated on when it is appropriate to go to the emergency department.   Kari Baars, FNP-C [facility] [phone]

## 2018-05-24 NOTE — Patient Instructions (Signed)
Acute Back Pain, Adult Acute back pain is sudden and usually short-lived. It is often caused by an injury to the muscles and tissues in the back. The injury may result from:  A muscle or ligament getting overstretched or torn (strained). Ligaments are tissues that connect bones to each other. Lifting something improperly can cause a back strain.  Wear and tear (degeneration) of the spinal disks. Spinal disks are circular tissue that provides cushioning between the bones of the spine (vertebrae).  Twisting motions, such as while playing sports or doing yard work.  A hit to the back.  Arthritis. You may have a physical exam, lab tests, and imaging tests to find the cause of your pain. Acute back pain usually goes away with rest and home care. Follow these instructions at home: Managing pain, stiffness, and swelling  Take over-the-counter and prescription medicines only as told by your health care provider.  Your health care provider may recommend applying ice during the first 24-48 hours after your pain starts. To do this: ? Put ice in a plastic bag. ? Place a towel between your skin and the bag. ? Leave the ice on for 20 minutes, 2-3 times a day.  If directed, apply heat to the affected area as often as told by your health care provider. Use the heat source that your health care provider recommends, such as a moist heat pack or a heating pad. ? Place a towel between your skin and the heat source. ? Leave the heat on for 20-30 minutes. ? Remove the heat if your skin turns bright red. This is especially important if you are unable to feel pain, heat, or cold. You have a greater risk of getting burned. Activity   Do not stay in bed. Staying in bed for more than 1-2 days can delay your recovery.  Sit up and stand up straight. Avoid leaning forward when you sit, or hunching over when you stand. ? If you work at a desk, sit close to it so you do not need to lean over. Keep your chin tucked  in. Keep your neck drawn back, and keep your elbows bent at a right angle. Your arms should look like the letter "L." ? Sit high and close to the steering wheel when you drive. Add lower back (lumbar) support to your car seat, if needed.  Take short walks on even surfaces as soon as you are able. Try to increase the length of time you walk each day.  Do not sit, drive, or stand in one place for more than 30 minutes at a time. Sitting or standing for long periods of time can put stress on your back.  Do not drive or use heavy machinery while taking prescription pain medicine.  Use proper lifting techniques. When you bend and lift, use positions that put less stress on your back: ? Crenshaw your knees. ? Keep the load close to your body. ? Avoid twisting.  Exercise regularly as told by your health care provider. Exercising helps your back heal faster and helps prevent back injuries by keeping muscles strong and flexible.  Work with a physical therapist to make a safe exercise program, as recommended by your health care provider. Do any exercises as told by your physical therapist. Lifestyle  Maintain a healthy weight. Extra weight puts stress on your back and makes it difficult to have good posture.  Avoid activities or situations that make you feel anxious or stressed. Stress and anxiety increase muscle  tension and can make back pain worse. Learn ways to manage anxiety and stress, such as through exercise. General instructions  Sleep on a firm mattress in a comfortable position. Try lying on your side with your knees slightly bent. If you lie on your back, put a pillow under your knees.  Follow your treatment plan as told by your health care provider. This may include: ? Cognitive or behavioral therapy. ? Acupuncture or massage therapy. ? Meditation or yoga. Contact a health care provider if:  You have pain that is not relieved with rest or medicine.  You have increasing pain going down  into your legs or buttocks.  Your pain does not improve after 2 weeks.  You have pain at night.  You lose weight without trying.  You have a fever or chills. Get help right away if:  You develop new bowel or bladder control problems.  You have unusual weakness or numbness in your arms or legs.  You develop nausea or vomiting.  You develop abdominal pain.  You feel faint. Summary  Acute back pain is sudden and usually short-lived.  Use proper lifting techniques. When you bend and lift, use positions that put less stress on your back.  Take over-the-counter and prescription medicines and apply heat or ice as directed by your health care provider. This information is not intended to replace advice given to you by your health care provider. Make sure you discuss any questions you have with your health care provider. Document Released: 02/07/2005 Document Revised: 09/14/2017 Document Reviewed: 09/21/2016 Elsevier Interactive Patient Education  2019 [employer]. Food Choices for Gastroesophageal Reflux Disease, Adult When you have gastroesophageal reflux disease (GERD), the foods you eat and your eating habits are very important. Choosing the right foods can help ease your discomfort. Think about working with a nutrition specialist (dietitian) to help you make good choices. What are tips for following this plan?  Meals  Choose healthy foods that are low in fat, such as fruits, vegetables, whole grains, low-fat dairy products, and lean meat, fish, and poultry.  Eat small meals often instead of 3 large meals a day. Eat your meals slowly, and in a place where you are relaxed. Avoid bending over or lying down until 2-3 hours after eating.  Avoid eating meals 2-3 hours before bed.  Avoid drinking a lot of liquid with meals.  Cook foods using methods other than frying. Bake, grill, or broil food instead.  Avoid or limit: ? Chocolate. ? Peppermint or  spearmint. ? Alcohol. ? Pepper. ? Black and decaffeinated coffee. ? Black and decaffeinated tea. ? Bubbly (carbonated) soft drinks. ? Caffeinated energy drinks and soft drinks.  Limit high-fat foods such as: ? Fatty meat or fried foods. ? Whole milk, cream, butter, or ice cream. ? Nuts and nut butters. ? Pastries, donuts, and sweets made with butter or shortening.  Avoid foods that cause symptoms. These foods may be different for everyone. Common foods that cause symptoms include: ? Tomatoes. ? Oranges, lemons, and limes. ? Peppers. ? Spicy food. ? Onions and garlic. ? Vinegar. Lifestyle  Maintain a healthy weight. Ask your doctor what weight is healthy for you. If you need to lose weight, work with your doctor to do so safely.  Exercise for at least 30 minutes for 5 or more days each week, or as told by your doctor.  Wear loose-fitting clothes.  Do not smoke. If you need help quitting, ask your doctor.  Sleep with the head  of your bed higher than your feet. Use a wedge under the mattress or blocks under the bed frame to raise the head of the bed. Summary  When you have gastroesophageal reflux disease (GERD), food and lifestyle choices are very important in easing your symptoms.  Eat small meals often instead of 3 large meals a day. Eat your meals slowly, and in a place where you are relaxed.  Limit high-fat foods such as fatty meat or fried foods.  Avoid bending over or lying down until 2-3 hours after eating.  Avoid peppermint and spearmint, caffeine, alcohol, and chocolate. This information is not intended to replace advice given to you by your health care provider. Make sure you discuss any questions you have with your health care provider. Document Released: 08/09/2011 Document Revised: 03/15/2016 Document Reviewed: 03/15/2016 Elsevier Interactive Patient Education  2019 [employer].

## 2018-05-25 LAB — URINE CULTURE

## 2018-07-05 ENCOUNTER — Encounter: Payer: Self-pay | Admitting: Family Medicine

## 2018-07-05 ENCOUNTER — Other Ambulatory Visit: Payer: Self-pay

## 2018-07-05 ENCOUNTER — Ambulatory Visit (INDEPENDENT_AMBULATORY_CARE_PROVIDER_SITE_OTHER): Payer: BLUE CROSS/BLUE SHIELD | Admitting: Family Medicine

## 2018-07-05 DIAGNOSIS — G8929 Other chronic pain: Secondary | ICD-10-CM | POA: Insufficient documentation

## 2018-07-05 DIAGNOSIS — M545 Low back pain, unspecified: Secondary | ICD-10-CM | POA: Insufficient documentation

## 2018-07-05 DIAGNOSIS — K219 Gastro-esophageal reflux disease without esophagitis: Secondary | ICD-10-CM

## 2018-07-05 MED ORDER — NAPROXEN 500 MG PO TABS: 500.0000 mg | ORAL_TABLET | Freq: Two times a day (BID) | ORAL | 1 refills | Status: DC

## 2018-07-05 NOTE — Progress Notes (Signed)
Virtual Visit via telephone Note Due to COVID-19, visit is conducted virtually and was requested by patient. This visit type was conducted due to national recommendations for restrictions regarding the COVID-19 Pandemic (e.g. social distancing) in an effort to limit this patient's exposure and mitigate transmission in our community. All issues noted in this document were discussed and addressed.  A physical exam was not performed with this format.   I connected with Amanda Lam on 07/05/18 at 0800 by telephone and verified that I am speaking with the correct person using two identifiers. Amanda Lam is currently located at home and family is currently with them during visit. The provider, Kari BaarsMichelle Nashea Chumney, FNP is located in their office at time of visit.  I discussed the limitations, risks, security and privacy concerns of performing an evaluation and management service by telephone and the availability of in person appointments. I also discussed with the patient that there may be a patient responsible charge related to this service. The patient expressed understanding and agreed to proceed.  Subjective:  Patient ID: Amanda Lam, female    DOB: February 09, 1991, 28 y.o.   MRN: [id number]  Chief Complaint:  Gastroesophageal Reflux and Back Pain   HPI: Amanda Lam is a 28 y.o. female presenting on 07/05/2018 for Gastroesophageal Reflux and Back Pain   Pt presents today for follow up of back pain and GERD. Pt states her GERD symptoms have completely resolved on the omeprazole. Pt states she still has intermittent back pain. States it has improved greatly, but still bothers her at times.   Gastroesophageal Reflux  She complains of abdominal pain and heartburn. She reports no chest pain, no coughing or no nausea. This is a recurrent problem. The current episode started more than 1 month ago. The problem occurs frequently. The problem has been resolved. The heartburn is located in the abdomen and  substernum. The heartburn is of moderate intensity. The heartburn does not wake her from sleep. The heartburn does not limit her activity. The heartburn doesn't change with position. The symptoms are aggravated by certain foods, ETOH, caffeine and lying down. Pertinent negatives include no anemia, fatigue, melena, muscle weakness, orthopnea or weight loss. Risk factors include caffeine use, ETOH use, lack of exercise and NSAIDs. She has tried a PPI for the symptoms. The treatment provided significant relief.  Back Pain  This is a chronic problem. The current episode started more than 1 month ago. The problem occurs rarely. The problem has been rapidly improving since onset. The pain is present in the lumbar spine and thoracic spine. The quality of the pain is described as aching. The pain does not radiate. The pain is at a severity of 3/10. The pain is mild. The symptoms are aggravated by bending, position and twisting. Associated symptoms include abdominal pain. Pertinent negatives include no bladder incontinence, bowel incontinence, chest pain, dysuria, fever, headaches, leg pain, numbness, paresis, paresthesias, pelvic pain, perianal numbness, tingling, weakness or weight loss. She has tried NSAIDs and ice for the symptoms. The treatment provided moderate relief.     Relevant past medical, surgical, family, and social history reviewed and updated as indicated.  Allergies and medications reviewed and updated.   Past Medical History:  Diagnosis Date  . Asthma     History reviewed. No pertinent surgical history.  Social History   Socioeconomic History  . Marital status: Single    Spouse name: Not on file  . Number of children: Not on file  .  Years of education: Not on file  . Highest education level: Not on file  Occupational History  . Not on file  Social Needs  . Financial resource strain: Not on file  . Food insecurity:    Worry: Not on file    Inability: Not on file  .  Transportation needs:    Medical: Not on file    Non-medical: Not on file  Tobacco Use  . Smoking status: Former Smoker    Packs/day: 0.50    Years: 7.00    Pack years: 3.50    Types: Cigarettes  . Smokeless tobacco: Never Used  Substance and Sexual Activity  . Alcohol use: No  . Drug use: No  . Sexual activity: Not Currently  Lifestyle  . Physical activity:    Days per week: Not on file    Minutes per session: Not on file  . Stress: Not on file  Relationships  . Social connections:    Talks on phone: Not on file    Gets together: Not on file    Attends religious service: Not on file    Active member of club or organization: Not on file    Attends meetings of clubs or organizations: Not on file    Relationship status: Not on file  . Intimate partner violence:    Fear of current or ex partner: Not on file    Emotionally abused: Not on file    Physically abused: Not on file    Forced sexual activity: Not on file  Other Topics Concern  . Not on file  Social History Narrative  . Not on file    Outpatient Encounter Medications as of 07/05/2018  Medication Sig  . naproxen (NAPROSYN) 500 MG tablet Take 1 tablet (500 mg total) by mouth 2 (two) times daily with a meal.  . omeprazole (PRILOSEC) 20 MG capsule Take 1 capsule (20 mg total) by mouth 2 (two) times daily before a meal for 30 days.   No facility-administered encounter medications on file as of 07/05/2018.     Allergies  Allergen Reactions  . Penicillins Anaphylaxis    Review of Systems  Constitutional: Negative for activity change, appetite change, chills, diaphoresis, fatigue, fever, unexpected weight change and weight loss.  Respiratory: Negative for cough, chest tightness and shortness of breath.   Cardiovascular: Negative for chest pain, palpitations and leg swelling.  Gastrointestinal: Positive for abdominal pain and heartburn. Negative for anal bleeding, blood in stool, bowel incontinence, constipation,  diarrhea, melena, nausea, rectal pain and vomiting.  Genitourinary: Negative for bladder incontinence, dysuria and pelvic pain.  Musculoskeletal: Positive for back pain. Negative for muscle weakness.  Neurological: Negative for dizziness, tingling, weakness, light-headedness, numbness, headaches and paresthesias.  Psychiatric/Behavioral: Negative for confusion.  All other systems reviewed and are negative.        Observations/Objective: No vital signs or physical exam, this was a telephone or virtual health encounter.  Pt alert and oriented, answers all questions appropriately, and able to speak in full sentences.    Assessment and Plan: Tobie was seen today for gastroesophageal reflux and back pain.  Diagnoses and all orders for this visit:  Gastroesophageal reflux disease without esophagitis Symptoms well controlled with medications and diet. Continue omeprazole. Report any new or worsening symptoms.   Chronic left-sided low back pain without sciatica Improving with NSAID use and topical creams. Naproxen as needed for pain. If pain does not resolve completely, will refer to PT.  -  naproxen (NAPROSYN) 500 MG tablet; Take 1 tablet (500 mg total) by mouth 2 (two) times daily with a meal.     Follow Up Instructions: Return in about 6 weeks (around 08/16/2018), or if symptoms worsen or fail to improve, for back pain.    I discussed the assessment and treatment plan with the patient. The patient was provided an opportunity to ask questions and all were answered. The patient agreed with the plan and demonstrated an understanding of the instructions.   The patient was advised to call back or seek an in-person evaluation if the symptoms worsen or if the condition fails to improve as anticipated.  The above assessment and management plan was discussed with the patient. The patient verbalized understanding of and has agreed to the management plan. Patient is aware to call the clinic  if symptoms persist or worsen. Patient is aware when to return to the clinic for a follow-up visit. Patient educated on when it is appropriate to go to the emergency department.    I provided 15 minutes of non-face-to-face time during this encounter. The call started at 0800. The call ended at 0810. The other time was used for coordination of care.    Kari Baars, FNP-C [facility] [address] [place], [place] [address] [phone]

## 2018-07-12 ENCOUNTER — Other Ambulatory Visit: Payer: Self-pay

## 2018-07-12 ENCOUNTER — Ambulatory Visit (INDEPENDENT_AMBULATORY_CARE_PROVIDER_SITE_OTHER): Payer: BLUE CROSS/BLUE SHIELD | Admitting: Family

## 2018-07-12 ENCOUNTER — Encounter: Payer: Self-pay | Admitting: Family

## 2018-07-12 DIAGNOSIS — F32A Depression, unspecified: Secondary | ICD-10-CM

## 2018-07-12 DIAGNOSIS — F329 Major depressive disorder, single episode, unspecified: Secondary | ICD-10-CM | POA: Diagnosis not present

## 2018-07-12 DIAGNOSIS — F419 Anxiety disorder, unspecified: Secondary | ICD-10-CM

## 2018-07-12 DIAGNOSIS — G4700 Insomnia, unspecified: Secondary | ICD-10-CM | POA: Diagnosis not present

## 2018-07-12 DIAGNOSIS — R519 Headache, unspecified: Secondary | ICD-10-CM

## 2018-07-12 DIAGNOSIS — R51 Headache: Secondary | ICD-10-CM | POA: Diagnosis not present

## 2018-07-12 MED ORDER — TRAZODONE HCL 50 MG PO TABS: 25.0000 mg | ORAL_TABLET | Freq: Every evening | ORAL | 3 refills | Status: DC | PRN

## 2018-07-12 NOTE — Progress Notes (Signed)
Virtual Visit via telephone Note  I connected with Amanda Lam on 07/12/18 at 10:22 AM by telephone and verified that I am speaking with the correct person using two identifiers. Amanda Lam is currently located at home and no one is currently with her during visit. The provider, Jannifer Rodneyhristy Hawks, FNP is located in their office at time of visit.  I discussed the limitations, risks, security and privacy concerns of performing an evaluation and management service by telephone and the availability of in person appointments. I also discussed with the patient that there may be a patient responsible charge related to this service. The patient expressed understanding and agreed to proceed.   History and Present Illness:  Pt calls the office today with a headache she has had for the last 5 days. She states she has not been able to sleep over the last weeks. She reports she only has slept 1 hour last night. She reports she was laid off of work related to COVID and was able to sleep fine at first, but now feels more anxious and depressed and can not sleep. She believes this is making her headache worse.  Headache   This is a new problem. The current episode started in the past 7 days. The problem occurs constantly. The problem has been unchanged. The pain is located in the left unilateral and frontal region. The pain does not radiate. The quality of the pain is described as aching. The pain is at a severity of 6/10. The pain is mild. Associated symptoms include nausea, photophobia and sinus pressure. Pertinent negatives include no blurred vision, coughing, loss of balance, phonophobia or vomiting. Exacerbated by: insomnia.      Review of Systems  HENT: Positive for sinus pressure.   Eyes: Positive for photophobia. Negative for blurred vision.  Respiratory: Negative for cough.   Gastrointestinal: Positive for nausea. Negative for vomiting.  Neurological: Positive for headaches. Negative for loss of  balance.  All other systems reviewed and are negative.    Observations/Objective: No SOB or distress   Assessment and Plan: 1. Nonintractable headache, unspecified chronicity pattern, unspecified headache type I believe this is related to stress and insomnia Stress management discussed Avoid caffeine Will start Trazodone to hopefully get enough sleep   2. Insomnia, unspecified type Sleep ritual  Discussed the importance of physical activity Try to become more active and exercise at home. She has not been as active since being laid off of work Will try Trazodone since she has tried OTC Take for the next 2-3 days, then only use as needed - traZODone (DESYREL) 50 MG tablet; Take 0.5-1 tablets (25-50 mg total) by mouth at bedtime as needed for sleep.  Dispense: 30 tablet; Refill: 3  3. Anxiety and depression Stress management discussed May need to start something if does not improve Follow up with PCP      I discussed the assessment and treatment plan with the patient. The patient was provided an opportunity to ask questions and all were answered. The patient agreed with the plan and demonstrated an understanding of the instructions.   The patient was advised to call back or seek an in-person evaluation if the symptoms worsen or if the condition fails to improve as anticipated.  The above assessment and management plan was discussed with the patient. The patient verbalized understanding of and has agreed to the management plan. Patient is aware to call the clinic if symptoms persist or worsen. Patient is aware when to  return to the clinic for a follow-up visit. Patient educated on when it is appropriate to go to the emergency department.   Time call ended:  10:39 AM  I provided 16 minutes of non-face-to-face time during this encounter.    Jannifer Rodney, FNP

## 2018-08-03 ENCOUNTER — Other Ambulatory Visit: Payer: Self-pay | Admitting: Family

## 2018-08-03 DIAGNOSIS — G4700 Insomnia, unspecified: Secondary | ICD-10-CM

## 2019-03-06 DIAGNOSIS — Z20828 Contact with and (suspected) exposure to other viral communicable diseases: Secondary | ICD-10-CM | POA: Diagnosis not present

## 2019-05-10 ENCOUNTER — Telehealth: Payer: Self-pay | Admitting: Family Medicine

## 2019-05-10 NOTE — Telephone Encounter (Signed)
Patient has been feeling fatigue and weakness for the last several days and wants to be seen by Dr. Louanne Skye on 05/16/19.  I offered her an appointment with another provider sooner but she declined.  Appointment scheduled at 7:55 am with Dr. Louanne Skye on 05/16/19.

## 2019-05-16 ENCOUNTER — Ambulatory Visit: Payer: BLUE CROSS/BLUE SHIELD | Admitting: Family Medicine

## 2019-08-12 ENCOUNTER — Ambulatory Visit (INDEPENDENT_AMBULATORY_CARE_PROVIDER_SITE_OTHER): Payer: BC Managed Care – PPO | Admitting: Family Medicine

## 2019-08-12 DIAGNOSIS — M62830 Muscle spasm of back: Secondary | ICD-10-CM | POA: Diagnosis not present

## 2019-08-12 MED ORDER — CYCLOBENZAPRINE HCL 10 MG PO TABS: 5.0000 mg | ORAL_TABLET | Freq: Three times a day (TID) | ORAL | 1 refills | Status: DC | PRN

## 2019-08-12 NOTE — Progress Notes (Signed)
Telephone visit  Subjective: CC: pulled back  PCP: [facility], Elige Radon, MD JGO:TLXBWI Amanda Lam is a 29 y.o. female calls for telephone consult today. Patient provides verbal consent for consult held via phone.  Due to COVID-19 pandemic this visit was conducted virtually. This visit type was conducted due to national recommendations for restrictions regarding the COVID-19 Pandemic (e.g. social distancing, sheltering in place) in an effort to limit this patient's exposure and mitigate transmission in our community. All issues noted in this document were discussed and addressed.  A physical exam was not performed with this format.   Location of patient: home Location of provider: [facility] Others present for call: none  1. Pulled muscle Patient reports that she thinks she pulled a muscle in between her shoulder blades.  She does a lot of heavy lifting.  She has had a similar episode before.  This has been previously relieved by muscle relaxer.  She has been using Motrin, aleve with no improvement.    Patient's last menstrual period was 07/18/2019.   ROS: Per HPI  Allergies  Allergen Reactions  . Penicillins Anaphylaxis   Past Medical History:  Diagnosis Date  . Asthma     Current Outpatient Medications:  .  naproxen (NAPROSYN) 500 MG tablet, Take 1 tablet (500 mg total) by mouth 2 (two) times daily with a meal., Disp: 60 tablet, Rfl: 1 .  omeprazole (PRILOSEC) 20 MG capsule, Take 1 capsule (20 mg total) by mouth 2 (two) times daily before a meal for 30 days., Disp: 60 capsule, Rfl: 3 .  traZODone (DESYREL) 50 MG tablet, TAKE 1/2-1 TABLETS (25-50 MG TOTAL) BY MOUTH AT BEDTIME AS NEEDED FOR SLEEP., Disp: 90 tablet, Rfl: 0  Assessment/ Plan: 29 y.o. female   1. Spasm of thoracic back muscle Continue oral NSAID.  Adequate hydration.  Flexeril added.  Caution sedation.  Work note provided.  Reasons for return discussed.  She voiced good understanding will follow up as needed -  cyclobenzaprine (FLEXERIL) 10 MG tablet; Take 0.5-1 tablets (5-10 mg total) by mouth 3 (three) times daily as needed for muscle spasms.  Dispense: 30 tablet; Refill: 1   Start time: 11:41am End time: 11:46am  Total time spent on patient care (including telephone call/ virtual visit): 10 minutes  Amanda Chubbuck Hulen Skains, DO [facility] [phone]

## 2019-08-12 NOTE — Patient Instructions (Signed)
Thoracic Strain A thoracic strain is an injury to the muscles or tendons that attach to the upper back. Tendons are tissues that connect muscle to bone. This injury is sometimes called a mid-back strain. A strain can be mild or very bad. A mild strain may take only 1-2 weeks to heal. A very bad strain involves torn muscles or tendons, so it may take 6-8 weeks to heal. What are the causes? This condition may be caused by:  A fall or a hit to the body.  Twisting or stretching the back too far. This may happen when doing activities that require a lot of energy, such as lifting heavy objects. In some cases, the cause may not be known. What increases the risk? This injury is more common in:  Athletes.  People who are very overweight (obese). What are the signs or symptoms?  Pain in the middle back, especially with movement. This is the main symptom.  Stiffness or limited range of motion.  Sudden muscle tightening (spasms). How is this treated? This condition may be treated with:  Resting the injured area.  Putting heat and cold on the injured area.  Medicines for pain and inflammation, such as NSAIDs.  Prescription medicine for pain or to relax the muscles. These may be used for a short time if needed.  Physical therapy. This will involve doing exercises to stretch and strengthen the middle back. Follow these instructions at home: Managing pain, stiffness, and swelling      If told, put ice on the injured area. ? Put ice in a plastic bag. ? Place a towel between your skin and the bag. ? Leave the ice on for 20 minutes, 2-3 times a day.  If told, put heat on the affected area. Do this as often as told by your doctor. Use the heat source that your doctor recommends, such as a moist heat pack or a heating pad. ? Place a towel between your skin and the heat source. ? Leave the heat on for 20-30 minutes. ? Remove the heat if your skin turns bright red. This is very important if  you are unable to feel pain, heat, or cold. You may have a greater risk of getting burned. Activity  Rest and return to your normal activities as told by your doctor. Ask your doctor what activities are safe for you.  Do exercises as told by your doctor. Medicines  Take over-the-counter and prescription medicines only as told by your doctor.  Ask your doctor if the medicine prescribed to you: ? Requires you to avoid driving or using heavy machinery. ? Can cause trouble pooping (constipation). You may need to take steps to prevent or treat trouble pooping:  Drink enough fluid to keep your pee (urine) pale yellow.  Take over-the-counter or prescription medicines.  Eat foods that are high in fiber. These include beans, whole grains, and fresh fruits and vegetables.  Limit foods that are high in fat and processed sugars. These include fried or sweet foods. Injury prevention To prevent a future mid-back injury:  Always warm up before physical activity or sports.  Cool down and stretch after being active.  Use correct form when playing sports and lifting heavy objects. Bend your knees before you lift heavy objects.  Use good posture when sitting and standing.  Stay physically fit and maintain a healthy weight. ? Do at least 150 minutes of moderate-intensity exercise each week, such as brisk walking or water aerobics. ? Do strength exercises   at least 2 times each week.  General instructions  Do not use any products that contain nicotine or tobacco. These products include cigarettes, e-cigarettes, and chewing tobacco. If you need help quitting, ask your doctor.  Keep all follow-up visits as told by your doctor. This is important. Contact a doctor if:  Your pain is not helped by medicine.  Your pain or stiffness is getting worse.  You have pain or stiffness in your neck or lower back. Get help right away if you:  Have shortness of breath.  Have chest pain.  Have weakness  or loss of feeling (numbness) in your legs or arms.  Cannot control when you pee (urinate). Summary  A thoracic strain is an injury to the muscles or tendons that attach to the upper back.  If told, put ice or heat on the affected area.  Rest and return to your normal activities as told by your doctor.  Keep all follow-up visits as told by your doctor. This information is not intended to replace advice given to you by your health care provider. Make sure you discuss any questions you have with your health care provider. Document Revised: 12/26/2017 Document Reviewed: 12/26/2017 Elsevier Patient Education  2020 [employer]  

## 2019-11-25 ENCOUNTER — Telehealth: Payer: Self-pay

## 2019-11-25 NOTE — Telephone Encounter (Signed)
Patient thinks she had a misscarage  Had clots and heavy bleeding and now has stopped.  Patient was only a week late for her cycle.  States she is not bleeding or cramping any more.  Appointment scheduled on Friday since that was the only day she could do.  Aware to let us know if she starts bleeding or cramping before her appointment.

## 2019-11-29 ENCOUNTER — Ambulatory Visit (INDEPENDENT_AMBULATORY_CARE_PROVIDER_SITE_OTHER): Payer: BC Managed Care – PPO | Admitting: Family

## 2019-11-29 ENCOUNTER — Encounter: Payer: Self-pay | Admitting: Family

## 2019-11-29 DIAGNOSIS — O039 Complete or unspecified spontaneous abortion without complication: Secondary | ICD-10-CM | POA: Diagnosis not present

## 2019-11-29 NOTE — Progress Notes (Signed)
   Virtual Visit via telephone Note Due to COVID-19 pandemic this visit was conducted virtually. This visit type was conducted due to national recommendations for restrictions regarding the COVID-19 Pandemic (e.g. social distancing, sheltering in place) in an effort to limit this patient's exposure and mitigate transmission in our community. All issues noted in this document were discussed and addressed.  A physical exam was not performed with this format.  I connected with Amanda Lam on 11/29/19 at 2:46 pm by telephone and verified that I am speaking with the correct person using two identifiers. Amanda Lam is currently located at home and no one is currently with her during visit. The provider, Jannifer Rodney, FNP is located in their office at time of visit.  I discussed the limitations, risks, security and privacy concerns of performing an evaluation and management service by telephone and the availability of in person appointments. I also discussed with the patient that there may be a patient responsible charge related to this service. The patient expressed understanding and agreed to proceed.   History and Present Illness:  HPI  PT calls the office today with complaints of spotting. She states her menstrual cycle was suppose to start 11/16/19, but started 11/22/19. She states her period stopped yesterday. She reports her period was very heavy for 3-4 days with intense back pain and cramping  She took a pregnancy test on 12/21/19 and it was positive.   She is currently on any birth control at this time and does report having sex about 3-4 weeks ago.   Denies any fever, abdominal pain, back pain, or bleeding now.   Review of Systems  All other systems reviewed and are negative.    Observations/Objective: No SOB or distress noted   Assessment and Plan: 1. Miscarriage Rest Force fluids Report any bleeding, fever, abdominal pain Safe sex    I discussed the assessment and  treatment plan with the patient. The patient was provided an opportunity to ask questions and all were answered. The patient agreed with the plan and demonstrated an understanding of the instructions.   The patient was advised to call back or seek an in-person evaluation if the symptoms worsen or if the condition fails to improve as anticipated.  The above assessment and management plan was discussed with the patient. The patient verbalized understanding of and has agreed to the management plan. Patient is aware to call the clinic if symptoms persist or worsen. Patient is aware when to return to the clinic for a follow-up visit. Patient educated on when it is appropriate to go to the emergency department.   Time call ended:  2:59 pm   I provided 13 minutes of non-face-to-face time during this encounter.    Jannifer Rodney, FNP

## 2019-12-24 ENCOUNTER — Telehealth: Payer: Self-pay

## 2019-12-25 NOTE — Telephone Encounter (Signed)
Pt returned missed call from nurse regarding appt request for pregnancy test and possible referral for OBGYN if pregnant. Explained to pt that Dr Louanne Skye does not have any openings until 01/30/20 but that I would add her to his cancellation list and if anything opened up with him, I would give her a call. Told pt I could schedule her with a different provider for just the pregnancy test but if it came back positive and she needed a referral then she would have to make another appt with Dr Dettinger so that insurance would pay since he is her PCP. Pt voiced understanding.

## 2019-12-25 NOTE — Telephone Encounter (Signed)
Lmtcb.

## 2019-12-25 NOTE — Telephone Encounter (Signed)
Called patient and verbalized as long as she did not have MCD or focus plan she could call and make her own appointment for OBGYN with out a referral.

## 2020-01-15 DIAGNOSIS — Z3201 Encounter for pregnancy test, result positive: Secondary | ICD-10-CM | POA: Diagnosis not present

## 2020-01-15 DIAGNOSIS — Z3689 Encounter for other specified antenatal screening: Secondary | ICD-10-CM | POA: Diagnosis not present

## 2020-01-15 DIAGNOSIS — N912 Amenorrhea, unspecified: Secondary | ICD-10-CM | POA: Diagnosis not present

## 2020-01-15 DIAGNOSIS — Z3682 Encounter for antenatal screening for nuchal translucency: Secondary | ICD-10-CM | POA: Diagnosis not present

## 2020-01-15 LAB — HM PAP SMEAR: HM Pap smear: NORMAL

## 2020-02-13 DIAGNOSIS — Z3481 Encounter for supervision of other normal pregnancy, first trimester: Secondary | ICD-10-CM | POA: Diagnosis not present

## 2020-02-13 DIAGNOSIS — Z3682 Encounter for antenatal screening for nuchal translucency: Secondary | ICD-10-CM | POA: Diagnosis not present

## 2020-02-13 DIAGNOSIS — Z3689 Encounter for other specified antenatal screening: Secondary | ICD-10-CM | POA: Diagnosis not present

## 2020-03-01 DIAGNOSIS — O98512 Other viral diseases complicating pregnancy, second trimester: Secondary | ICD-10-CM | POA: Diagnosis not present

## 2020-03-01 DIAGNOSIS — U071 COVID-19: Secondary | ICD-10-CM | POA: Diagnosis not present

## 2020-03-01 DIAGNOSIS — R509 Fever, unspecified: Secondary | ICD-10-CM | POA: Diagnosis not present

## 2020-03-01 DIAGNOSIS — M791 Myalgia, unspecified site: Secondary | ICD-10-CM | POA: Diagnosis not present

## 2020-03-01 DIAGNOSIS — B349 Viral infection, unspecified: Secondary | ICD-10-CM | POA: Diagnosis not present

## 2020-03-01 DIAGNOSIS — R112 Nausea with vomiting, unspecified: Secondary | ICD-10-CM | POA: Diagnosis not present

## 2020-03-01 DIAGNOSIS — Z20822 Contact with and (suspected) exposure to covid-19: Secondary | ICD-10-CM | POA: Diagnosis not present

## 2020-03-01 DIAGNOSIS — R519 Headache, unspecified: Secondary | ICD-10-CM | POA: Diagnosis not present

## 2020-03-12 DIAGNOSIS — Z3A16 16 weeks gestation of pregnancy: Secondary | ICD-10-CM | POA: Diagnosis not present

## 2020-03-12 DIAGNOSIS — Z3689 Encounter for other specified antenatal screening: Secondary | ICD-10-CM | POA: Diagnosis not present

## 2020-04-09 DIAGNOSIS — Z3689 Encounter for other specified antenatal screening: Secondary | ICD-10-CM | POA: Diagnosis not present

## 2020-06-05 DIAGNOSIS — E876 Hypokalemia: Secondary | ICD-10-CM | POA: Diagnosis not present

## 2020-06-05 DIAGNOSIS — M545 Low back pain, unspecified: Secondary | ICD-10-CM | POA: Diagnosis not present

## 2020-06-05 DIAGNOSIS — D649 Anemia, unspecified: Secondary | ICD-10-CM | POA: Diagnosis not present

## 2020-06-05 DIAGNOSIS — O99283 Endocrine, nutritional and metabolic diseases complicating pregnancy, third trimester: Secondary | ICD-10-CM | POA: Diagnosis not present

## 2020-06-05 DIAGNOSIS — R109 Unspecified abdominal pain: Secondary | ICD-10-CM | POA: Diagnosis not present

## 2020-06-05 DIAGNOSIS — Z3A28 28 weeks gestation of pregnancy: Secondary | ICD-10-CM | POA: Diagnosis not present

## 2020-06-05 DIAGNOSIS — O26893 Other specified pregnancy related conditions, third trimester: Secondary | ICD-10-CM | POA: Diagnosis not present

## 2020-06-05 DIAGNOSIS — R1032 Left lower quadrant pain: Secondary | ICD-10-CM | POA: Diagnosis not present

## 2020-06-05 DIAGNOSIS — J45909 Unspecified asthma, uncomplicated: Secondary | ICD-10-CM | POA: Diagnosis not present

## 2020-06-05 DIAGNOSIS — O99513 Diseases of the respiratory system complicating pregnancy, third trimester: Secondary | ICD-10-CM | POA: Diagnosis not present

## 2020-06-05 DIAGNOSIS — N200 Calculus of kidney: Secondary | ICD-10-CM | POA: Diagnosis not present

## 2020-06-05 DIAGNOSIS — R112 Nausea with vomiting, unspecified: Secondary | ICD-10-CM | POA: Diagnosis not present

## 2020-06-05 DIAGNOSIS — O99113 Other diseases of the blood and blood-forming organs and certain disorders involving the immune mechanism complicating pregnancy, third trimester: Secondary | ICD-10-CM | POA: Diagnosis not present

## 2020-06-05 DIAGNOSIS — O99013 Anemia complicating pregnancy, third trimester: Secondary | ICD-10-CM | POA: Diagnosis not present

## 2020-06-05 DIAGNOSIS — Z87442 Personal history of urinary calculi: Secondary | ICD-10-CM | POA: Diagnosis not present

## 2020-06-05 DIAGNOSIS — Z87891 Personal history of nicotine dependence: Secondary | ICD-10-CM | POA: Diagnosis not present

## 2020-06-05 DIAGNOSIS — Z333 Pregnant state, gestational carrier: Secondary | ICD-10-CM | POA: Diagnosis not present

## 2020-06-05 DIAGNOSIS — D72829 Elevated white blood cell count, unspecified: Secondary | ICD-10-CM | POA: Diagnosis not present

## 2020-06-05 DIAGNOSIS — O212 Late vomiting of pregnancy: Secondary | ICD-10-CM | POA: Diagnosis not present

## 2020-06-09 DIAGNOSIS — Z3689 Encounter for other specified antenatal screening: Secondary | ICD-10-CM | POA: Diagnosis not present

## 2020-06-09 DIAGNOSIS — Z23 Encounter for immunization: Secondary | ICD-10-CM | POA: Diagnosis not present

## 2020-06-10 ENCOUNTER — Telehealth: Payer: Self-pay | Admitting: Family Medicine

## 2020-06-10 NOTE — Telephone Encounter (Signed)
[facility] does accept new babies please call mom to let her know she can make appt when baby is born.

## 2020-06-10 NOTE — Telephone Encounter (Signed)
Pt is having a child and wanted to know if Dr Louanne Skye would take her on as a pt. Due date is July 7th.

## 2020-06-15 DIAGNOSIS — O99810 Abnormal glucose complicating pregnancy: Secondary | ICD-10-CM | POA: Diagnosis not present

## 2020-07-23 DIAGNOSIS — O24410 Gestational diabetes mellitus in pregnancy, diet controlled: Secondary | ICD-10-CM | POA: Diagnosis not present

## 2020-07-30 DIAGNOSIS — O24410 Gestational diabetes mellitus in pregnancy, diet controlled: Secondary | ICD-10-CM | POA: Diagnosis not present

## 2020-07-30 DIAGNOSIS — Z3689 Encounter for other specified antenatal screening: Secondary | ICD-10-CM | POA: Diagnosis not present

## 2020-08-19 DIAGNOSIS — Z28310 Unvaccinated for covid-19: Secondary | ICD-10-CM | POA: Diagnosis not present

## 2020-08-19 DIAGNOSIS — O99824 Streptococcus B carrier state complicating childbirth: Secondary | ICD-10-CM | POA: Diagnosis not present

## 2020-08-19 DIAGNOSIS — U071 COVID-19: Secondary | ICD-10-CM | POA: Diagnosis not present

## 2020-08-19 DIAGNOSIS — Z3A39 39 weeks gestation of pregnancy: Secondary | ICD-10-CM | POA: Diagnosis not present

## 2020-08-19 DIAGNOSIS — O24419 Gestational diabetes mellitus in pregnancy, unspecified control: Secondary | ICD-10-CM | POA: Diagnosis not present

## 2020-08-19 DIAGNOSIS — O24429 Gestational diabetes mellitus in childbirth, unspecified control: Secondary | ICD-10-CM | POA: Diagnosis not present

## 2020-08-19 DIAGNOSIS — O9852 Other viral diseases complicating childbirth: Secondary | ICD-10-CM | POA: Diagnosis not present

## 2020-08-19 DIAGNOSIS — O24410 Gestational diabetes mellitus in pregnancy, diet controlled: Secondary | ICD-10-CM | POA: Diagnosis not present

## 2020-08-19 DIAGNOSIS — J45909 Unspecified asthma, uncomplicated: Secondary | ICD-10-CM | POA: Diagnosis not present

## 2020-08-19 DIAGNOSIS — O9952 Diseases of the respiratory system complicating childbirth: Secondary | ICD-10-CM | POA: Diagnosis not present

## 2020-11-24 ENCOUNTER — Encounter: Payer: Self-pay | Admitting: Nurse Practitioner

## 2020-11-24 ENCOUNTER — Ambulatory Visit (INDEPENDENT_AMBULATORY_CARE_PROVIDER_SITE_OTHER): Payer: Medicaid Other | Admitting: Nurse Practitioner

## 2020-11-24 VITALS — Temp 100.1°F

## 2020-11-24 DIAGNOSIS — J029 Acute pharyngitis, unspecified: Secondary | ICD-10-CM

## 2020-11-24 LAB — VERITOR FLU A/B WAIVED
Influenza A: NEGATIVE
Influenza B: NEGATIVE

## 2020-11-24 LAB — RAPID STREP SCREEN (MED CTR MEBANE ONLY): Strep Gp A Ag, IA W/Reflex: NEGATIVE

## 2020-11-24 LAB — CULTURE, GROUP A STREP

## 2020-11-24 MED ORDER — DOXYCYCLINE HYCLATE 100 MG PO TABS: 100.0000 mg | ORAL_TABLET | Freq: Two times a day (BID) | ORAL | 0 refills | Status: DC

## 2020-11-24 NOTE — Assessment & Plan Note (Signed)
Take meds as prescribed - Use a cool mist humidifier  -Use saline nose sprays frequently -Force fluids -For fever or aches or pains- take Tylenol or ibuprofen. -COVID-19/strep swab ordered.  We will treat pending results. Follow up with worsening unresolved symptoms

## 2020-11-24 NOTE — Progress Notes (Signed)
   Virtual Visit  Note Due to COVID-19 pandemic this visit was conducted virtually. This visit type was conducted due to national recommendations for restrictions regarding the COVID-19 Pandemic (e.g. social distancing, sheltering in place) in an effort to limit this patient's exposure and mitigate transmission in our community. All issues noted in this document were discussed and addressed.  A physical exam was not performed with this format.  I connected with Amanda Lam on 11/24/20 at 08:55 am by telephone and verified that I am speaking with the correct person using two identifiers. Amanda Lam is currently located at home during visit. The provider, Daryll Drown, NP is located in their office at time of visit.  I discussed the limitations, risks, security and privacy concerns of performing an evaluation and management service by telephone and the availability of in person appointments. I also discussed with the patient that there may be a patient responsible charge related to this service. The patient expressed understanding and agreed to proceed.   History and Present Illness:  Sore Throat  This is a new problem. The current episode started yesterday. The problem has been gradually worsening. Neither side of throat is experiencing more pain than the other. The maximum temperature recorded prior to her arrival was 100.4 - 100.9 F. The fever has been present for 1 to 2 days. The pain is moderate. Associated symptoms include congestion and swollen glands. Pertinent negatives include no coughing, shortness of breath or vomiting. She has had no exposure to strep. She has tried acetaminophen for the symptoms. The treatment provided mild relief.     Review of Systems  Constitutional:  Positive for chills, fever and malaise/fatigue.  HENT:  Positive for congestion.   Respiratory:  Negative for cough and shortness of breath.   Gastrointestinal:  Negative for vomiting.  Skin:  Negative for  rash.    Observations/Objective: Televisit patient not in distress.  Assessment and Plan: Take meds as prescribed - Use a cool mist humidifier  -Use saline nose sprays frequently -Force fluids -For fever or aches or pains- take Tylenol or ibuprofen. -COVID-19/strep swab ordered.  We will treat pending results. Follow up with worsening unresolved symptoms   Follow Up Instructions: Follow-up with unresolved symptoms.    I discussed the assessment and treatment plan with the patient. The patient was provided an opportunity to ask questions and all were answered. The patient agreed with the plan and demonstrated an understanding of the instructions.   The patient was advised to call back or seek an in-person evaluation if the symptoms worsen or if the condition fails to improve as anticipated.  The above assessment and management plan was discussed with the patient. The patient verbalized understanding of and has agreed to the management plan. Patient is aware to call the clinic if symptoms persist or worsen. Patient is aware when to return to the clinic for a follow-up visit. Patient educated on when it is appropriate to go to the emergency department.   Time call ended: 9 :01AM  I provided 6 minutes of  non face-to-face time during this encounter.    Daryll Drown, NP

## 2020-11-25 LAB — SARS-COV-2, NAA 2 DAY TAT

## 2020-11-25 LAB — NOVEL CORONAVIRUS, NAA: SARS-CoV-2, NAA: NOT DETECTED

## 2020-11-27 ENCOUNTER — Ambulatory Visit (INDEPENDENT_AMBULATORY_CARE_PROVIDER_SITE_OTHER): Payer: Medicaid Other | Admitting: Family Medicine

## 2020-11-27 ENCOUNTER — Encounter: Payer: Self-pay | Admitting: Family Medicine

## 2020-11-27 VITALS — BP 118/70 | HR 74 | Temp 97.3°F

## 2020-11-27 DIAGNOSIS — J0100 Acute maxillary sinusitis, unspecified: Secondary | ICD-10-CM | POA: Diagnosis not present

## 2020-11-27 DIAGNOSIS — K1379 Other lesions of oral mucosa: Secondary | ICD-10-CM | POA: Diagnosis not present

## 2020-11-27 MED ORDER — NYSTATIN 100000 UNIT/ML MT SUSP: 5.0000 mL | Freq: Four times a day (QID) | OROMUCOSAL | 1 refills | Status: DC | PRN

## 2020-11-27 NOTE — Progress Notes (Signed)
Acute Office Visit  Subjective:    Patient ID: Amanda Lam, female    DOB: [birth year], 30 y.o.   MRN: [id number]  Chief Complaint  Patient presents with   Facial Pain    HPI Patient is in today for facial pain for at least 5 days. She started feeling bad about 1 week ago with fever, chills, and sore throat. She had a negative strep, flu, and covid test 5 days ago. Her sore throat has resolved but her mouth feels raw and sore. She reports sinus pressure around her cheeks and jaw. She was not aware that doxycyline had been called in for her with her last visit, so she has not been taking this.   Past Medical History:  Diagnosis Date   Asthma     No past surgical history on file.  Family History  Problem Relation Age of Onset   Diabetes Mother    Diabetes Father    Diabetes Maternal Grandmother     Social History   Socioeconomic History   Marital status: Single    Spouse name: Not on file   Number of children: Not on file   Years of education: Not on file   Highest education level: Not on file  Occupational History   Not on file  Tobacco Use   Smoking status: Former    Packs/day: 0.50    Years: 7.00    Pack years: 3.50    Types: Cigarettes   Smokeless tobacco: Never  Vaping Use   Vaping Use: Never used  Substance and Sexual Activity   Alcohol use: No   Drug use: No   Sexual activity: Not Currently  Other Topics Concern   Not on file  Social History Narrative   Not on file   Social Determinants of Health   Financial Resource Strain: Not on file  Food Insecurity: Not on file  Transportation Needs: Not on file  Physical Activity: Not on file  Stress: Not on file  Social Connections: Not on file  Intimate Partner Violence: Not on file    Outpatient Medications Prior to Visit  Medication Sig Dispense Refill   doxycycline (VIBRA-TABS) 100 MG tablet Take 1 tablet (100 mg total) by mouth 2 (two) times daily. (Patient not taking: Reported on 11/27/2020) 14  tablet 0   cyclobenzaprine (FLEXERIL) 10 MG tablet Take 0.5-1 tablets (5-10 mg total) by mouth 3 (three) times daily as needed for muscle spasms. 30 tablet 1   naproxen (NAPROSYN) 500 MG tablet Take 1 tablet (500 mg total) by mouth 2 (two) times daily with a meal. 60 tablet 1   omeprazole (PRILOSEC) 20 MG capsule Take 1 capsule (20 mg total) by mouth 2 (two) times daily before a meal for 30 days. 60 capsule 3   traZODone (DESYREL) 50 MG tablet TAKE 1/2-1 TABLETS (25-50 MG TOTAL) BY MOUTH AT BEDTIME AS NEEDED FOR SLEEP. 90 tablet 0   No facility-administered medications prior to visit.    Allergies  Allergen Reactions   Penicillins Anaphylaxis    Review of Systems As per HPI.    Objective:    Physical Exam Vitals and nursing note reviewed.  Constitutional:      General: She is not in acute distress.    Appearance: She is not ill-appearing, toxic-appearing or diaphoretic.  HENT:     Head:     Comments: Maxillary tenderness    Right Ear: Tympanic membrane, ear canal and external ear normal.     Left  Ear: Tympanic membrane, ear canal and external ear normal.     Nose: Congestion present.     Mouth/Throat:     Lips: Pink.     Mouth: Mucous membranes are moist.     Tongue: No lesions.     Pharynx: Oropharynx is clear.     Tonsils: No tonsillar exudate or tonsillar abscesses. 1+ on the right. 1+ on the left.     Comments: Ulcers present to hard palate Eyes:     Extraocular Movements: Extraocular movements intact.     Conjunctiva/sclera: Conjunctivae normal.     Pupils: Pupils are equal, round, and reactive to light.  Cardiovascular:     Rate and Rhythm: Normal rate and regular rhythm.  Pulmonary:     Effort: Pulmonary effort is normal. No respiratory distress.     Breath sounds: Normal breath sounds.  Musculoskeletal:     Cervical back: No tenderness.  Lymphadenopathy:     Cervical: No cervical adenopathy.  Skin:    General: Skin is warm and dry.  Neurological:      Mental Status: She is alert.  Psychiatric:        Mood and Affect: Mood normal.        Behavior: Behavior normal.    BP 118/70   Pulse 74   Temp (!) 97.3 F (36.3 C) (Temporal)  Wt Readings from Last 3 Encounters:  05/24/18 198 lb 3.2 oz (89.9 kg)  02/26/16 222 lb (100.7 kg)  09/02/15 213 lb (96.6 kg)    Health Maintenance Due  Topic Date Due   COVID-19 Vaccine (1) Never done   PAP SMEAR-Modifier  07/28/2018   INFLUENZA VACCINE  Never done    There are no preventive care reminders to display for this patient.   Lab Results  Component Value Date   TSH 1.800 08/05/2015   Lab Results  Component Value Date   WBC 8.4 07/28/2015   HGB 13.7 07/28/2015   HCT 39.8 07/28/2015   MCV 80 07/28/2015   PLT 363 07/28/2015   Lab Results  Component Value Date   NA 144 07/28/2015   K 4.1 07/28/2015   CO2 17 (L) 07/28/2015   GLUCOSE 103 (H) 07/28/2015   BUN 13 07/28/2015   CREATININE 0.64 07/28/2015   CALCIUM 9.6 07/28/2015   No results found for: CHOL No results found for: HDL No results found for: LDLCALC No results found for: TRIG No results found for: [facility] Lab Results  Component Value Date   HGBA1C 5.4 08/05/2015       Assessment & Plan:   Amanda Lam was seen today for facial pain.  Diagnoses and all orders for this visit:  Acute non-recurrent maxillary sinusitis Start doxycyline as previously ordered. Dicussed OTC decongestants.   Acute pain of mouth -     magic mouthwash (nystatin, lidocaine, diphenhydrAMINE) suspension; Take 5 mLs by mouth 4 (four) times daily as needed for mouth pain.  Return to office for new or worsening symptoms, or if symptoms persist.   The patient indicates understanding of these issues and agrees with the plan.  Gabriel Earing, FNP

## 2020-11-30 ENCOUNTER — Encounter: Payer: Self-pay | Admitting: Family Medicine

## 2021-01-01 IMAGING — DX ABDOMEN - 1 VIEW
2 series · 2 of 2 positions shown · non-contrast
Comparison: None.

CLINICAL DATA: Flank pain

EXAM:
ABDOMEN - 1 VIEW

[abdomen kub (1 of 2)]
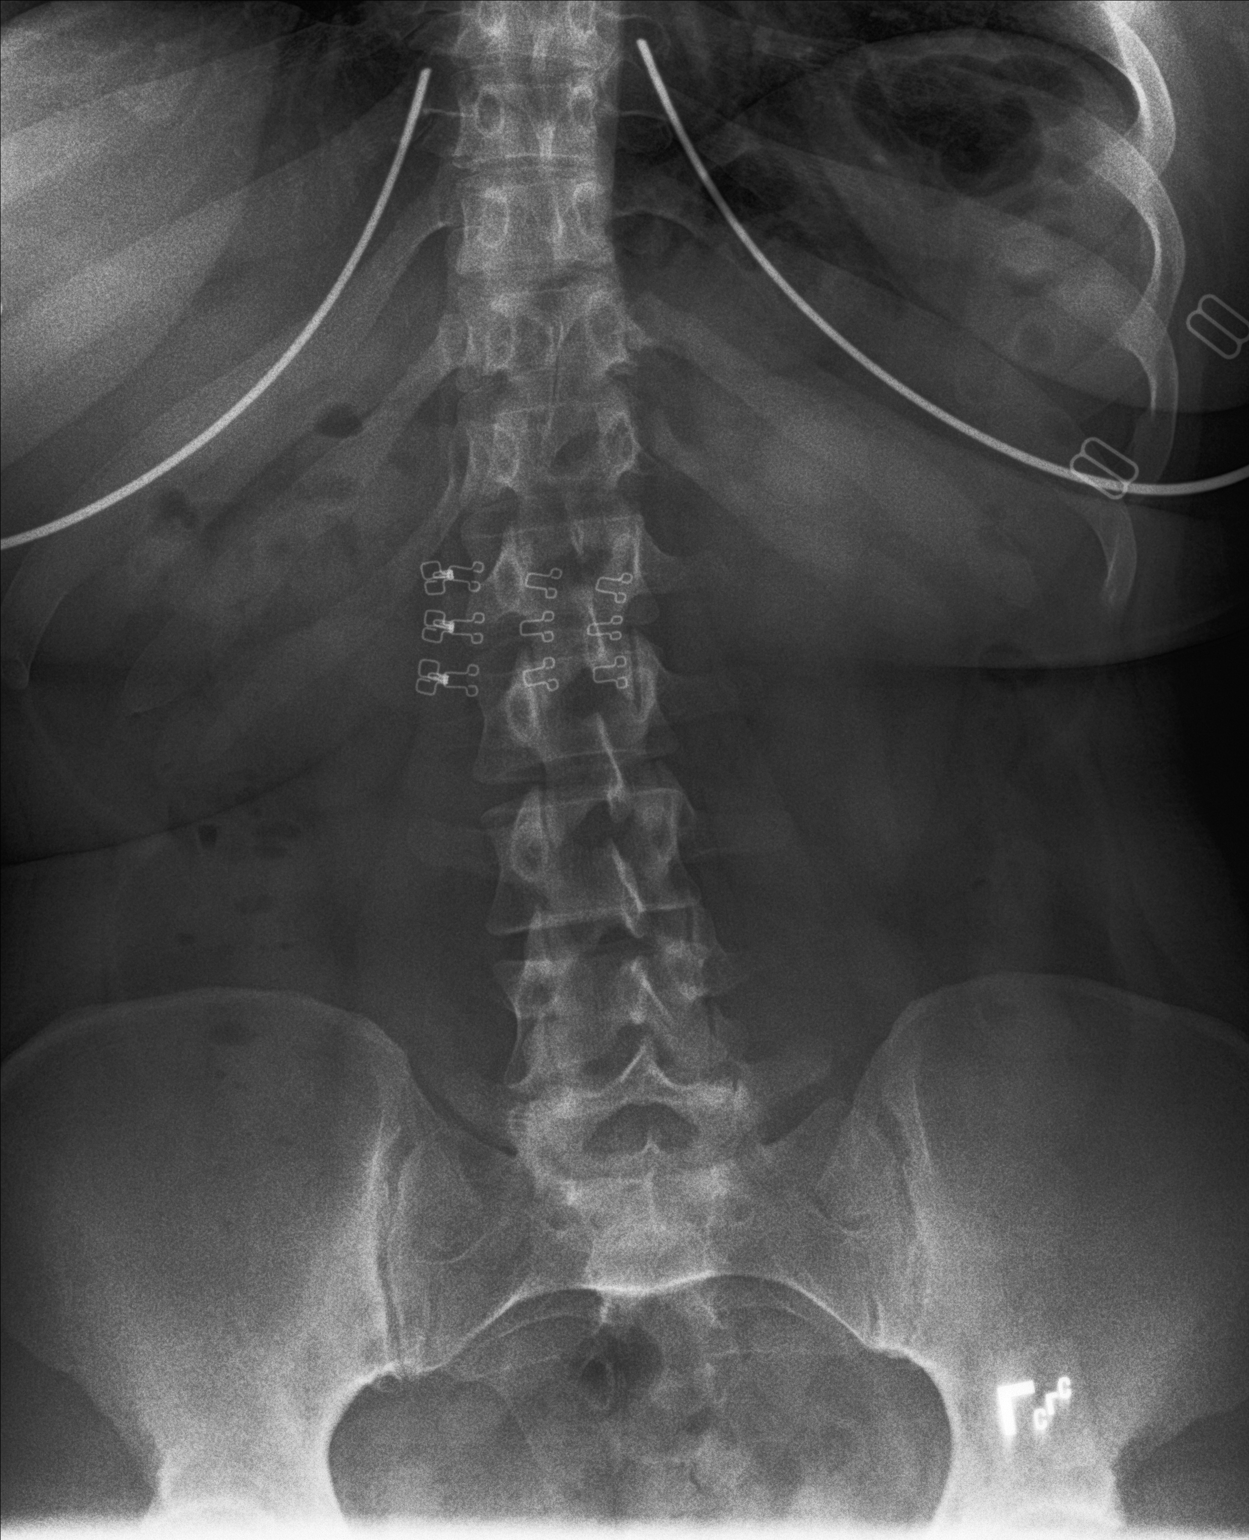

[abdomen kub (2 of 2)]
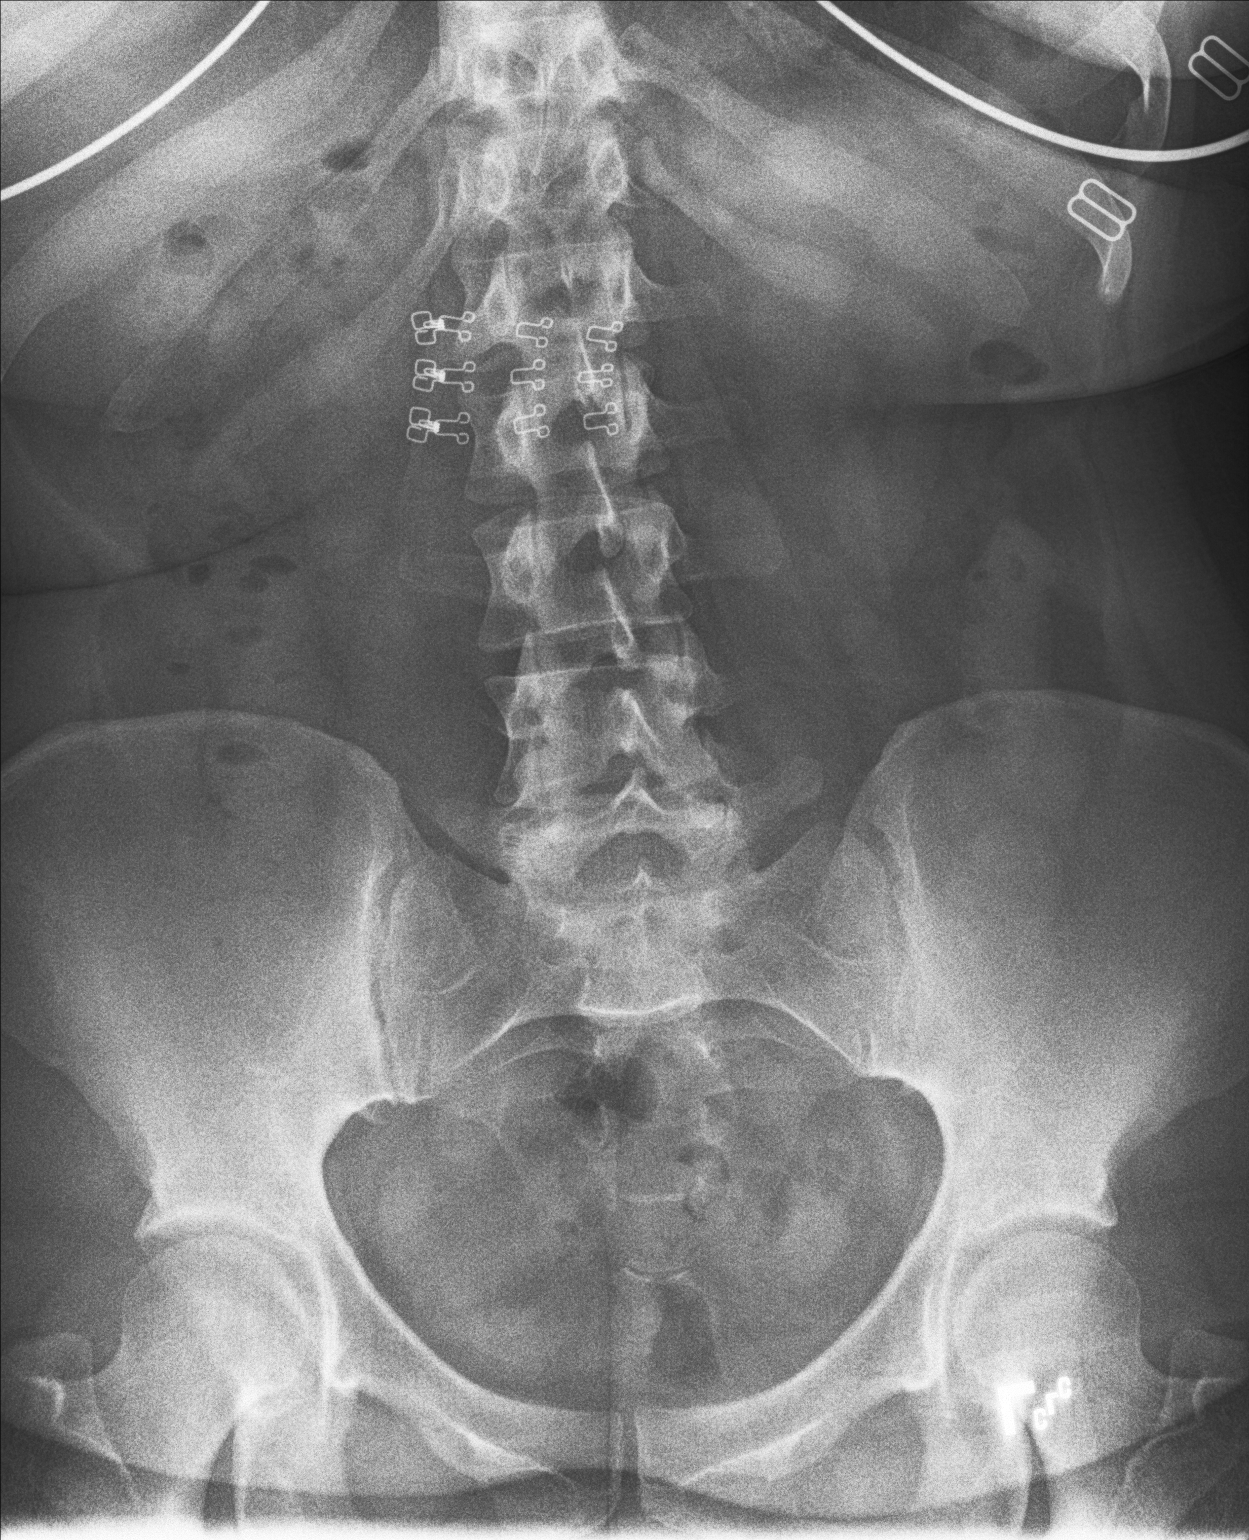

[2 of 2 positions shown; findings below may reference images not displayed]

FINDINGS: The bowel gas pattern is normal. No radio-opaque calculi or other
significant radiographic abnormality are seen.
IMPRESSION: Negative.

## 2021-03-31 ENCOUNTER — Encounter: Payer: Self-pay | Admitting: Family Medicine

## 2021-03-31 ENCOUNTER — Ambulatory Visit (INDEPENDENT_AMBULATORY_CARE_PROVIDER_SITE_OTHER): Payer: Medicaid Other | Admitting: Family Medicine

## 2021-03-31 DIAGNOSIS — K219 Gastro-esophageal reflux disease without esophagitis: Secondary | ICD-10-CM

## 2021-03-31 MED ORDER — OMEPRAZOLE 20 MG PO CPDR: 20.0000 mg | DELAYED_RELEASE_CAPSULE | Freq: Every day | ORAL | 3 refills | Status: DC

## 2021-03-31 NOTE — Progress Notes (Signed)
Virtual Visit via telephone Note  I connected with Amanda Lam on 03/31/21 at 1713 by telephone and verified that I am speaking with the correct person using two identifiers. Amanda Lam is currently located at home and patient are currently with her during visit. The provider, Fransisca Kaufmann Christophor Eick, MD is located in their office at time of visit.  Call ended at 1724  I discussed the limitations, risks, security and privacy concerns of performing an evaluation and management service by telephone and the availability of in person appointments. I also discussed with the patient that there may be a patient responsible charge related to this service. The patient expressed understanding and agreed to proceed.   History and Present Illness: Patient is calling in for abdominal indigestion.  She has had this since she had her gallbladder removed.  She has also been gaining weight since her gallbladder removed.  When she eats she feels sick to her stomach. When she eats she feels nauseated.  She tries to stay away from spicy foods.  She occasionally gets constipated. She gets nauseous but no vomiting.  She has upper abdominal issues. She drink water it helps or a soda and it helps.   1. Gastroesophageal reflux disease without esophagitis     Outpatient Encounter Medications as of 03/31/2021  Medication Sig   omeprazole (PRILOSEC) 20 MG capsule Take 1 capsule (20 mg total) by mouth daily.   magic mouthwash (nystatin, lidocaine, diphenhydrAMINE) suspension Take 5 mLs by mouth 4 (four) times daily as needed for mouth pain.   [DISCONTINUED] doxycycline (VIBRA-TABS) 100 MG tablet Take 1 tablet (100 mg total) by mouth 2 (two) times daily. (Patient not taking: Reported on 11/27/2020)   No facility-administered encounter medications on file as of 03/31/2021.    Review of Systems  Constitutional:  Negative for chills and fever.  Eyes:  Negative for visual disturbance.  Respiratory:  Negative for chest  tightness and shortness of breath.   Cardiovascular:  Negative for chest pain and leg swelling.  Gastrointestinal:  Positive for abdominal pain and nausea. Negative for abdominal distention, diarrhea and vomiting.  Musculoskeletal:  Negative for back pain and gait problem.  Skin:  Negative for rash.  Neurological:  Negative for light-headedness and headaches.  Psychiatric/Behavioral:  Negative for agitation and behavioral problems.   All other systems reviewed and are negative.  Observations/Objective: Patient sounds comfortable  Assessment and Plan: Problem List Items Addressed This Visit       Digestive   Gastroesophageal reflux disease without esophagitis - Primary   Relevant Medications   omeprazole (PRILOSEC) 20 MG capsule    Will start acid reflux medicine back up, it sounds like she is having some indigestion, also recommended some dietary changes for gallbladder. Follow up plan: Return if symptoms worsen or fail to improve.     I discussed the assessment and treatment plan with the patient. The patient was provided an opportunity to ask questions and all were answered. The patient agreed with the plan and demonstrated an understanding of the instructions.   The patient was advised to call back or seek an in-person evaluation if the symptoms worsen or if the condition fails to improve as anticipated.  The above assessment and management plan was discussed with the patient. The patient verbalized understanding of and has agreed to the management plan. Patient is aware to call the clinic if symptoms persist or worsen. Patient is aware when to return to the clinic for a follow-up visit.  Patient educated on when it is appropriate to go to the emergency department.    I provided 11 minutes of non-face-to-face time during this encounter.    Worthy Rancher, MD

## 2021-04-22 ENCOUNTER — Encounter: Payer: Self-pay | Admitting: Family Medicine

## 2021-04-22 ENCOUNTER — Ambulatory Visit: Payer: Medicaid Other | Admitting: Family Medicine

## 2021-04-22 VITALS — BP 129/86 | HR 82 | Ht 63.0 in | Wt 212.0 lb

## 2021-04-22 DIAGNOSIS — R5383 Other fatigue: Secondary | ICD-10-CM | POA: Diagnosis not present

## 2021-04-22 DIAGNOSIS — Z13220 Encounter for screening for lipoid disorders: Secondary | ICD-10-CM | POA: Diagnosis not present

## 2021-04-22 DIAGNOSIS — F32A Depression, unspecified: Secondary | ICD-10-CM

## 2021-04-22 DIAGNOSIS — Z131 Encounter for screening for diabetes mellitus: Secondary | ICD-10-CM | POA: Diagnosis not present

## 2021-04-22 DIAGNOSIS — F419 Anxiety disorder, unspecified: Secondary | ICD-10-CM

## 2021-04-22 DIAGNOSIS — K219 Gastro-esophageal reflux disease without esophagitis: Secondary | ICD-10-CM

## 2021-04-22 DIAGNOSIS — R635 Abnormal weight gain: Secondary | ICD-10-CM

## 2021-04-22 LAB — LIPID PANEL

## 2021-04-22 LAB — BAYER DCA HB A1C WAIVED: HB A1C (BAYER DCA - WAIVED): 4.8 % (ref 4.8–5.6)

## 2021-04-22 MED ORDER — SERTRALINE HCL 50 MG PO TABS: 50.0000 mg | ORAL_TABLET | Freq: Every day | ORAL | 2 refills | Status: DC

## 2021-04-22 NOTE — Progress Notes (Signed)
? ?BP 129/86   Pulse 82   Ht '5\' 3"'  (1.6 m)   Wt 212 lb (96.2 kg)   SpO2 97%   BMI 37.55 kg/m?   ? ?Subjective:  ? ?Patient ID: Amanda Lam, female    DOB: [birth year], 31 y.o.   MRN: [id number] ? ?HPI: ?Amanda Lam is a 31 y.o. female presenting on 04/22/2021 for Medical Management of Chronic Issues, Fatigue, and Weight Gain ? ? ?HPI ?Patient is coming in today complaining of fatigue and decreased energy and weight gain.  She did have a baby 8 months ago that had her gallbladder removed after that.  She says since then she just feels like she cannot lose weight and has been gaining weight.  She does also admit that her mother passed away in 05-Mar-2023 and she has been feeling more down and depressed and anxious.  She says she is not sleeping as well at night because the baby is still in the bed and waking up at least once or twice at night.  She says she does have decreased motivation and more crying specially associated to the loss of her mother.  She also says that she has been forgetting to take her omeprazole and causing more heartburn than makes her feel more hungry throughout the day. ? ?Relevant past medical, surgical, family and social history reviewed and updated as indicated. Interim medical history since our last visit reviewed. ?Allergies and medications reviewed and updated. ? ?Review of Systems  ?Constitutional:  Positive for appetite change, fatigue and unexpected weight change. Negative for chills and fever.  ?Eyes:  Negative for visual disturbance.  ?Respiratory:  Negative for chest tightness and shortness of breath.   ?Cardiovascular:  Negative for chest pain and leg swelling.  ?Musculoskeletal:  Negative for back pain and gait problem.  ?Skin:  Negative for rash.  ?Neurological:  Negative for dizziness, light-headedness and headaches.  ?Psychiatric/Behavioral:  Positive for dysphoric mood and sleep disturbance. Negative for agitation, behavioral problems, self-injury and suicidal ideas. The  patient is nervous/anxious.   ?All other systems reviewed and are negative. ? ?Per HPI unless specifically indicated above ? ? ?Allergies as of 04/22/2021   ? ?   Reactions  ? Penicillins Anaphylaxis  ? ?  ? ?  ?Medication List  ?  ? ?  ? Accurate as of April 22, 2021 10:56 AM. If you have any questions, ask your nurse or doctor.  ?  ?  ? ?  ? ?STOP taking these medications   ? ?magic mouthwash (nystatin, lidocaine, diphenhydrAMINE) suspension ?Stopped by: Worthy Rancher, MD ?  ? ?  ? ?TAKE these medications   ? ?omeprazole 20 MG capsule ?Commonly known as: PRILOSEC ?Take 1 capsule (20 mg total) by mouth daily. ?  ?sertraline 50 MG tablet ?Commonly known as: Zoloft ?Take 1 tablet (50 mg total) by mouth daily. ?Started by: Worthy Rancher, MD ?  ?Sronyx 0.1-20 MG-MCG tablet ?Generic drug: levonorgestrel-ethinyl estradiol ?Take 1 tablet by mouth daily. ?  ? ?  ? ? ? ?Objective:  ? ?BP 129/86   Pulse 82   Ht '5\' 3"'  (1.6 m)   Wt 212 lb (96.2 kg)   SpO2 97%   BMI 37.55 kg/m?   ?Wt Readings from Last 3 Encounters:  ?04/22/21 212 lb (96.2 kg)  ?05/24/18 198 lb 3.2 oz (89.9 kg)  ?02/26/16 222 lb (100.7 kg)  ?  ?Physical Exam ?Vitals and nursing note reviewed.  ?Constitutional:   ?  General: She is not in acute distress. ?   Appearance: She is well-developed. She is not diaphoretic.  ?Eyes:  ?   Conjunctiva/sclera: Conjunctivae normal.  ?Cardiovascular:  ?   Rate and Rhythm: Normal rate and regular rhythm.  ?   Heart sounds: Normal heart sounds. No murmur heard. ?Pulmonary:  ?   Effort: Pulmonary effort is normal. No respiratory distress.  ?   Breath sounds: Normal breath sounds. No wheezing.  ?Abdominal:  ?   General: Abdomen is flat. Bowel sounds are normal. There is no distension.  ?   Tenderness: There is no abdominal tenderness. There is no guarding or rebound.  ?Musculoskeletal:     ?   General: No swelling or tenderness. Normal range of motion.  ?Skin: ?   General: Skin is warm and dry.  ?   Findings: No  rash.  ?Neurological:  ?   Mental Status: She is alert and oriented to person, place, and time.  ?   Coordination: Coordination normal.  ?Psychiatric:     ?   Behavior: Behavior normal.  ? ? ? ? ?Assessment & Plan:  ? ?Problem List Items Addressed This Visit   ? ?  ? Digestive  ? Gastroesophageal reflux disease without esophagitis  ? Relevant Orders  ? CBC with Differential/Platelet  ?  ? Other  ? Anxiety and depression  ? Relevant Medications  ? sertraline (ZOLOFT) 50 MG tablet  ? ?Other Visit Diagnoses   ? ? Other fatigue    -  Primary  ? Relevant Orders  ? CBC with Differential/Platelet  ? CMP14+EGFR  ? Lipid panel  ? TSH  ? Bayer DCA Hb A1c Waived  ? Lipid screening      ? Relevant Orders  ? Lipid panel  ? Diabetes mellitus screening      ? Relevant Orders  ? CMP14+EGFR  ? Bayer DCA Hb A1c Waived  ? Weight gain      ? Relevant Orders  ? CBC with Differential/Platelet  ? CMP14+EGFR  ? Lipid panel  ? TSH  ? Bayer DCA Hb A1c Waived  ? ?  ?Recommended she work on sleep training with the child and get her out of her bed.  Also recommended to start taking the omeprazole more consistently to help with her stomach. ? ?We will start Zoloft to see if it helps. ? ?Follow up plan: ?Return in about 4 weeks (around 05/20/2021), or if symptoms worsen or fail to improve, for Anxiety depression and weight recheck. ? ?Counseling provided for all of the vaccine components ?Orders Placed This Encounter  ?Procedures  ? HM PAP SMEAR  ? CBC with Differential/Platelet  ? CMP14+EGFR  ? Lipid panel  ? TSH  ? Bayer DCA Hb A1c Waived  ? ? ?Caryl Pina, MD ?[place] ?04/22/2021, 10:56 AM ? ? ? ? ?

## 2021-04-23 LAB — CBC WITH DIFFERENTIAL/PLATELET
Basophils Absolute: 0.1 10*3/uL (ref 0.0–0.2)
Basos: 1 %
EOS (ABSOLUTE): 0.2 10*3/uL (ref 0.0–0.4)
Eos: 3 %
Hematocrit: 41.2 % (ref 34.0–46.6)
Hemoglobin: 13.9 g/dL (ref 11.1–15.9)
Immature Grans (Abs): 0 10*3/uL (ref 0.0–0.1)
Immature Granulocytes: 0 %
Lymphocytes Absolute: 2 10*3/uL (ref 0.7–3.1)
Lymphs: 34 %
MCH: 27.9 pg (ref 26.6–33.0)
MCHC: 33.7 g/dL (ref 31.5–35.7)
MCV: 83 fL (ref 79–97)
Monocytes Absolute: 0.4 10*3/uL (ref 0.1–0.9)
Monocytes: 7 %
Neutrophils Absolute: 3.1 10*3/uL (ref 1.4–7.0)
Neutrophils: 55 %
Platelets: 319 10*3/uL (ref 150–450)
RBC: 4.99 x10E6/uL (ref 3.77–5.28)
RDW: 12.4 % (ref 11.7–15.4)
WBC: 5.7 10*3/uL (ref 3.4–10.8)

## 2021-04-23 LAB — CMP14+EGFR
ALT: 11 IU/L (ref 0–32)
AST: 13 IU/L (ref 0–40)
Albumin/Globulin Ratio: 1.4 (ref 1.2–2.2)
Albumin: 4.1 g/dL (ref 3.8–4.8)
Alkaline Phosphatase: 70 IU/L (ref 44–121)
BUN/Creatinine Ratio: 14 (ref 9–23)
BUN: 10 mg/dL (ref 6–20)
Bilirubin Total: <0.2 mg/dL (ref 0.0–1.2)
CO2: 23 mmol/L (ref 20–29)
Calcium: 9.3 mg/dL (ref 8.7–10.2)
Chloride: 107 mmol/L — ABNORMAL HIGH (ref 96–106)
Creatinine, Ser: 0.7 mg/dL (ref 0.57–1.00)
Globulin, Total: 2.9 g/dL (ref 1.5–4.5)
Glucose: 86 mg/dL (ref 70–99)
Potassium: 5.1 mmol/L (ref 3.5–5.2)
Sodium: 142 mmol/L (ref 134–144)
Total Protein: 7 g/dL (ref 6.0–8.5)
eGFR: 119 mL/min/{1.73_m2} (ref >59)

## 2021-04-23 LAB — LIPID PANEL
Chol/HDL Ratio: 4.6 ratio — ABNORMAL HIGH (ref 0.0–4.4)
Cholesterol, Total: 197 mg/dL (ref 100–199)
HDL: 43 mg/dL (ref >39)
LDL Chol Calc (NIH): 132 mg/dL — ABNORMAL HIGH (ref 0–99)
Triglycerides: 119 mg/dL (ref 0–149)
VLDL Cholesterol Cal: 22 mg/dL (ref 5–40)

## 2021-04-23 LAB — TSH: TSH: 1.53 u[IU]/mL (ref 0.450–4.500)

## 2021-04-29 ENCOUNTER — Telehealth: Payer: Self-pay

## 2021-04-29 NOTE — Telephone Encounter (Signed)
Patient reports that since she started the Sertraline two days ago she has been feeling nauseous after taking medication.  I advised patient to eat before taking and to give her body a few more days to adjust to the medications and if symptoms did not improve to call us. ?

## 2021-04-29 NOTE — Telephone Encounter (Signed)
Yes that could be a side effect, do try to give it a few more days because usually will go away ?

## 2021-05-04 ENCOUNTER — Other Ambulatory Visit: Payer: Self-pay | Admitting: Family Medicine

## 2021-05-04 DIAGNOSIS — K219 Gastro-esophageal reflux disease without esophagitis: Secondary | ICD-10-CM

## 2021-05-20 ENCOUNTER — Ambulatory Visit: Payer: Medicaid Other | Admitting: Family Medicine

## 2021-07-02 ENCOUNTER — Ambulatory Visit: Payer: Medicaid Other | Admitting: Family Medicine

## 2021-07-15 ENCOUNTER — Encounter: Payer: Self-pay | Admitting: Family Medicine

## 2021-07-15 ENCOUNTER — Ambulatory Visit: Payer: Medicaid Other | Admitting: Family Medicine

## 2021-07-15 DIAGNOSIS — F419 Anxiety disorder, unspecified: Secondary | ICD-10-CM

## 2021-07-15 DIAGNOSIS — K219 Gastro-esophageal reflux disease without esophagitis: Secondary | ICD-10-CM | POA: Diagnosis not present

## 2021-07-15 DIAGNOSIS — F32A Depression, unspecified: Secondary | ICD-10-CM

## 2021-07-15 MED ORDER — OMEPRAZOLE 20 MG PO CPDR: DELAYED_RELEASE_CAPSULE | ORAL | 3 refills | Status: DC

## 2021-07-15 MED ORDER — SERTRALINE HCL 100 MG PO TABS: 100.0000 mg | ORAL_TABLET | Freq: Every day | ORAL | 2 refills | Status: DC

## 2021-07-15 NOTE — Progress Notes (Signed)
BP 125/85   Pulse 74   Ht 5\' 3"  (1.6 m)   Wt 214 lb (97.1 kg)   SpO2 97%   BMI 37.91 kg/m    Subjective:   Patient ID: Tymber Bettye, female    DOB: February 17, 1991, 31 y.o.   MRN: [id number]  HPI: Bronwyn ALAYHA BABINEAUX is a 31 y.o. female presenting on 07/15/2021 for Medical Management of Chronic Issues, Anxiety, and depression   HPI Anxiety and depression recheck Patient is coming in for anxiety depression recheck.  She says initially she was feeling a little bit better on the Zoloft but now she just does not feel like it is made a huge difference.  She did have stomach issues initially but those have tapered off and she does admit that she is not sleeping as well at night since she has been on the medicine.  She is taking it at nighttime.  She says panic attacks are better but she just still feels anxious and depressed and her weight is not doing well and that is not helping the way she is feeling.  She denies any suicidal ideations or thoughts of hurting herself.  She has a 51-year-old child and she feels like relationship there is going well.    07/15/2021   11:06 AM 04/22/2021   10:01 AM 07/05/2018    8:05 AM 05/24/2018    8:13 AM 02/26/2016   10:29 AM  Depression screen PHQ 2/9  Decreased Interest 1 0 0 0 0  Down, Depressed, Hopeless 1 1 0 0 0  PHQ - 2 Score 2 1 0 0 0  Altered sleeping 3 1     Tired, decreased energy 3 3     Change in appetite 2 3     Feeling bad or failure about yourself  1 0     Trouble concentrating 1 0     Moving slowly or fidgety/restless 0 0     Suicidal thoughts 0 0     PHQ-9 Score 12 8     Difficult doing work/chores Somewhat difficult        GERD Patient is currently on omeprazole.  She denies any major symptoms or abdominal pain or belching or burping. She denies any blood in her stool or lightheadedness or dizziness.   Relevant past medical, surgical, family and social history reviewed and updated as indicated. Interim medical history since our last visit  reviewed. Allergies and medications reviewed and updated.  Review of Systems  Constitutional:  Positive for unexpected weight change. Negative for chills and fever.  Eyes:  Negative for visual disturbance.  Respiratory:  Negative for chest tightness and shortness of breath.   Cardiovascular:  Negative for chest pain and leg swelling.  Psychiatric/Behavioral:  Positive for dysphoric mood. Negative for agitation, behavioral problems, self-injury, sleep disturbance and suicidal ideas. The patient is nervous/anxious.   All other systems reviewed and are negative.  Per HPI unless specifically indicated above   Allergies as of 07/15/2021       Reactions   Penicillins Anaphylaxis        Medication List        Accurate as of Jul 15, 2021 12:55 PM. If you have any questions, ask your nurse or doctor.          omeprazole 20 MG capsule Commonly known as: PRILOSEC TAKE 1 CAPSULE BY MOUTH EVERY DAY What changed:  how much to take how to take this when to take this additional instructions Changed  by: Nils Pyle, MD   sertraline 100 MG tablet Commonly known as: Zoloft Take 1 tablet (100 mg total) by mouth daily. What changed:  medication strength how much to take Changed by: Elige Radon Geno Sydnor, MD   Sronyx 0.1-20 MG-MCG tablet Generic drug: levonorgestrel-ethinyl estradiol Take 1 tablet by mouth daily.         Objective:   BP 125/85   Pulse 74   Ht 5\' 3"  (1.6 m)   Wt 214 lb (97.1 kg)   SpO2 97%   BMI 37.91 kg/m   Wt Readings from Last 3 Encounters:  07/15/21 214 lb (97.1 kg)  04/22/21 212 lb (96.2 kg)  05/24/18 198 lb 3.2 oz (89.9 kg)    Physical Exam Vitals and nursing note reviewed.  Constitutional:      General: She is not in acute distress.    Appearance: She is well-developed. She is not diaphoretic.  Eyes:     Conjunctiva/sclera: Conjunctivae normal.  Skin:    General: Skin is warm and dry.     Findings: No rash.  Neurological:      Mental Status: She is alert and oriented to person, place, and time.     Coordination: Coordination normal.  Psychiatric:        Mood and Affect: Mood is anxious. Mood is not depressed.        Behavior: Behavior normal.        Thought Content: Thought content does not include suicidal ideation. Thought content does not include suicidal plan.      Assessment & Plan:   Problem List Items Addressed This Visit       Digestive   Gastroesophageal reflux disease without esophagitis   Relevant Medications   omeprazole (PRILOSEC) 20 MG capsule     Other   Anxiety and depression   Relevant Medications   sertraline (ZOLOFT) 100 MG tablet    Increase zoloft to 100 mg and recheck in 1 month Follow up plan: Return in about 4 weeks (around 08/12/2021), or if symptoms worsen or fail to improve, for Depression and anxiety recheck.  Counseling provided for all of the vaccine components No orders of the defined types were placed in this encounter.   08/14/2021, MD [facility] 07/15/2021, 12:55 PM

## 2021-08-02 ENCOUNTER — Ambulatory Visit (INDEPENDENT_AMBULATORY_CARE_PROVIDER_SITE_OTHER): Payer: Medicaid Other | Admitting: Nurse Practitioner

## 2021-08-02 ENCOUNTER — Other Ambulatory Visit: Payer: Self-pay | Admitting: Nurse Practitioner

## 2021-08-02 ENCOUNTER — Encounter: Payer: Self-pay | Admitting: Nurse Practitioner

## 2021-08-02 VITALS — BP 119/69 | HR 85 | Temp 98.1°F | Ht 64.0 in | Wt 213.4 lb

## 2021-08-02 DIAGNOSIS — N898 Other specified noninflammatory disorders of vagina: Secondary | ICD-10-CM

## 2021-08-02 DIAGNOSIS — Z202 Contact with and (suspected) exposure to infections with a predominantly sexual mode of transmission: Secondary | ICD-10-CM

## 2021-08-02 DIAGNOSIS — B3731 Acute candidiasis of vulva and vagina: Secondary | ICD-10-CM

## 2021-08-02 LAB — WET PREP FOR TRICH, YEAST, CLUE
Clue Cell Exam: NEGATIVE
Trichomonas Exam: NEGATIVE
Yeast Exam: POSITIVE — AB

## 2021-08-02 MED ORDER — FLUCONAZOLE 150 MG PO TABS: 150.0000 mg | ORAL_TABLET | Freq: Once | ORAL | 0 refills | Status: AC

## 2021-08-02 NOTE — Progress Notes (Signed)
Acute Office Visit  Subjective:     Patient ID: Amanda Lam, female    DOB: [birth year], 31 y.o.   MRN: [id number]  Chief Complaint  Patient presents with   Vaginitis   Vaginal Itching    Few weeks - getting worse    Vaginal Discharge    Light greenish / yellowish - 1 week ago - thick     Vaginal Itching The patient's primary symptoms include vaginal discharge. Pertinent negatives include no rash.  Vaginal Discharge The patient's primary symptoms include vaginal discharge. Pertinent negatives include no rash.    Review of Systems  Constitutional: Negative.   HENT: Negative.    Eyes: Negative.   Respiratory: Negative.    Cardiovascular: Negative.   Genitourinary:  Positive for vaginal discharge.  Musculoskeletal: Negative.   Skin: Negative.  Negative for rash.  Neurological: Negative.   Psychiatric/Behavioral:  The patient is not nervous/anxious.   All other systems reviewed and are negative.       Objective:    BP 119/69   Pulse 85   Temp 98.1 F (36.7 C)   Ht 5\' 4"  (1.626 m)   Wt 213 lb 6.4 oz (96.8 kg)   LMP 07/19/2021 (Approximate) Comment: end of the month  SpO2 98%   BMI 36.63 kg/m  BP Readings from Last 3 Encounters:  08/02/21 119/69  07/15/21 125/85  04/22/21 129/86   Wt Readings from Last 3 Encounters:  08/02/21 213 lb 6.4 oz (96.8 kg)  07/15/21 214 lb (97.1 kg)  04/22/21 212 lb (96.2 kg)      Physical Exam Vitals and nursing note reviewed.  Constitutional:      Appearance: Normal appearance.  HENT:     Head: Normocephalic.     Right Ear: Ear canal and external ear normal.     Left Ear: Ear canal and external ear normal.     Nose: Nose normal.     Mouth/Throat:     Mouth: Mucous membranes are moist.     Pharynx: Oropharynx is clear.  Eyes:     Conjunctiva/sclera: Conjunctivae normal.  Cardiovascular:     Rate and Rhythm: Normal rate and regular rhythm.     Pulses: Normal pulses.     Heart sounds: Normal heart sounds.   Pulmonary:     Effort: Pulmonary effort is normal.     Breath sounds: Normal breath sounds.  Abdominal:     General: Bowel sounds are normal.  Skin:    General: Skin is warm.  Neurological:     General: No focal deficit present.     Mental Status: She is alert and oriented to person, place, and time.  Psychiatric:        Mood and Affect: Mood normal.        Behavior: Behavior normal.     Results for orders placed or performed in visit on 08/02/21  WET PREP FOR TRICH, YEAST, CLUE   Specimen: Vaginal Fluid   Vaginal Flui  Result Value Ref Range   Trichomonas Exam Negative Negative   Yeast Exam Positive (A) Negative   Clue Cell Exam Negative Negative        Assessment & Plan:  On assessment patient denies abdominal tenderness, CVA tenderness, fever, chills or pelvic pain. Completed wet prep with results pending. Education provided to patient for safe sex.  Patient verbalized understanding. Follow-up for worsening or unresolved symptoms. Problem List Items Addressed This Visit   None Visit Diagnoses     STD  exposure    -  Primary   Relevant Orders   WET PREP FOR TRICH, YEAST, CLUE (Completed)   Ct, Ng, Mycoplasmas NAA, Urine   Vaginal discharge           No orders of the defined types were placed in this encounter.   Return if symptoms worsen or fail to improve.  Daryll Drown, NP

## 2021-08-02 NOTE — Patient Instructions (Signed)
Safe Sex Practicing safe sex means taking steps before and during sex to reduce your risk of: Getting an STI (sexually transmitted infection). Giving your partner an STI. Unwanted or unplanned pregnancy. How to practice safe sex Ways you can practice safe sex  Limit your sexual partners to only one partner who is having sex with only you. Avoid using alcohol and drugs before having sex. Alcohol and drugs can affect your judgment. Before having sex with a new partner: Talk to your partner about past partners, past STIs, and drug use. Get screened for STIs and discuss the results with your partner. Ask your partner to get screened too. Check your body regularly for sores, blisters, rashes, or unusual discharge. If you notice any of these problems, visit your health care provider. Avoid sexual contact if you have symptoms of an infection or you are being treated for an STI. While having sex, use a condom. Make sure to: Use a condom every time you have vaginal, oral, or anal sex. Both females and males should wear condoms during oral sex. Keep condoms in place from the beginning to the end of sexual activity. Use a latex condom, if possible. Latex condoms offer the best protection. Use only water-based lubricants with a condom. Using petroleum-based lubricants or oils will weaken the condom and increase the chance that it will break. Ways your health care provider can help you practice safe sex  See your health care provider for regular screenings, exams, and tests for STIs. Talk with your health care provider about what kind of birth control (contraception) is best for you. Get vaccinated against hepatitis B and human papillomavirus (HPV). If you are at risk of being infected with HIV (human immunodeficiency virus), talk with your health care provider about taking a prescription medicine to prevent HIV infection. You are at risk for HIV if you: Are a man who has sex with other men. Are  sexually active with more than one partner. Take drugs by injection. Have a sex partner who has HIV. Have unprotected sex. Have sex with someone who has sex with both men and women. Have had an STI. Follow these instructions at home: Take over-the-counter and prescription medicines only as told by your health care provider. Keep all follow-up visits. This is important. Where to find more information Centers for Disease Control and Prevention: www.cdc.gov Planned Parenthood: www.plannedparenthood.org Office on Women's Health: www.womenshealth.gov Summary Practicing safe sex means taking steps before and during sex to reduce your risk getting an STI, giving your partner an STI, and having an unwanted or unplanned pregnancy. Before having sex with a new partner, talk to your partner about past partners, past STIs, and drug use. Use a condom every time you have vaginal, oral, or anal sex. Both females and males should wear condoms during oral sex. Check your body regularly for sores, blisters, rashes, or unusual discharge. If you notice any of these problems, visit your health care provider. See your health care provider for regular screenings, exams, and tests for STIs. This information is not intended to replace advice given to you by your health care provider. Make sure you discuss any questions you have with your health care provider. Document Revised: 07/15/2019 Document Reviewed: 07/15/2019 Elsevier Patient Education  2023 Elsevier Inc.  

## 2021-08-06 ENCOUNTER — Other Ambulatory Visit: Payer: Self-pay | Admitting: Family Medicine

## 2021-08-06 DIAGNOSIS — F419 Anxiety disorder, unspecified: Secondary | ICD-10-CM

## 2021-08-11 ENCOUNTER — Ambulatory Visit (INDEPENDENT_AMBULATORY_CARE_PROVIDER_SITE_OTHER): Payer: Medicaid Other | Admitting: Family Medicine

## 2021-08-11 ENCOUNTER — Encounter: Payer: Self-pay | Admitting: Family Medicine

## 2021-08-11 DIAGNOSIS — F419 Anxiety disorder, unspecified: Secondary | ICD-10-CM

## 2021-08-11 DIAGNOSIS — F32A Depression, unspecified: Secondary | ICD-10-CM

## 2021-08-11 MED ORDER — SERTRALINE HCL 100 MG PO TABS: 100.0000 mg | ORAL_TABLET | Freq: Every day | ORAL | 1 refills | Status: DC

## 2021-08-11 NOTE — Progress Notes (Signed)
BP 128/85   Pulse 73   Temp 98.2 F (36.8 C)   Ht 5\' 4"  (1.626 m)   Wt 212 lb (96.2 kg)   LMP 07/19/2021 (Approximate) Comment: end of the month  SpO2 99%   BMI 36.39 kg/m    Subjective:   Patient ID: Amanda Lam, female    DOB: 19-Dec-1990, 31 y.o.   MRN: [id number]  HPI: Amanda Lam is a 31 y.o. female presenting on 08/11/2021 for Medical Management of Chronic Issues, Anxiety, and Depression   HPI Anxiety depression recheck Patient is coming in today for anxiety depression recheck.  She is currently on Zoloft which was increased to 100 mg last time she was here for a visit and she feels like she is doing a lot better and it is mostly controlled on her day-to-day but she still gets some panicky episodes of a lot of anxiety episodes about once a week still.  She is says she feels a lot better but she still concerned about increasing dose because every time they increase the dose she gets a lot of GI symptoms so she is hesitant.  She is feeling a lot happier    08/11/2021    4:06 PM 08/02/2021   10:17 AM 07/15/2021   11:06 AM 04/22/2021   10:01 AM 07/05/2018    8:05 AM  Depression screen PHQ 2/9  Decreased Interest 0 0 1 0 0  Down, Depressed, Hopeless 0 1 1 1  0  PHQ - 2 Score 0 1 2 1  0  Altered sleeping 0 0 3 1   Tired, decreased energy 2 1 3 3    Change in appetite 2 0 2 3   Feeling bad or failure about yourself  0 0 1 0   Trouble concentrating 0 0 1 0   Moving slowly or fidgety/restless 0 0 0 0   Suicidal thoughts 0 0 0 0   PHQ-9 Score 4 2 12 8    Difficult doing work/chores Not difficult at all Somewhat difficult Somewhat difficult       Relevant past medical, surgical, family and social history reviewed and updated as indicated. Interim medical history since our last visit reviewed. Allergies and medications reviewed and updated.  Review of Systems  Constitutional:  Negative for chills and fever.  Eyes:  Negative for visual disturbance.  Respiratory:  Negative for  chest tightness and shortness of breath.   Cardiovascular:  Negative for chest pain and leg swelling.  Musculoskeletal:  Negative for back pain and gait problem.  Skin:  Negative for rash.  Neurological:  Negative for light-headedness and headaches.  Psychiatric/Behavioral:  Positive for dysphoric mood and sleep disturbance. Negative for agitation, behavioral problems, self-injury and suicidal ideas. The patient is nervous/anxious.   All other systems reviewed and are negative.   Per HPI unless specifically indicated above   Allergies as of 08/11/2021       Reactions   Penicillins Anaphylaxis        Medication List        Accurate as of August 11, 2021  4:28 PM. If you have any questions, ask your nurse or doctor.          omeprazole 20 MG capsule Commonly known as: PRILOSEC TAKE 1 CAPSULE BY MOUTH EVERY DAY   sertraline 100 MG tablet Commonly known as: ZOLOFT Take 1 tablet (100 mg total) by mouth daily.   Sronyx 0.1-20 MG-MCG tablet Generic drug: levonorgestrel-ethinyl estradiol Take 1 tablet by mouth daily.  Objective:   BP 128/85   Pulse 73   Temp 98.2 F (36.8 C)   Ht 5\' 4"  (1.626 m)   Wt 212 lb (96.2 kg)   LMP 07/19/2021 (Approximate) Comment: end of the month  SpO2 99%   BMI 36.39 kg/m   Wt Readings from Last 3 Encounters:  08/11/21 212 lb (96.2 kg)  08/02/21 213 lb 6.4 oz (96.8 kg)  07/15/21 214 lb (97.1 kg)    Physical Exam Vitals and nursing note reviewed.  Constitutional:      General: She is not in acute distress.    Appearance: She is well-developed. She is not diaphoretic.  Eyes:     Conjunctiva/sclera: Conjunctivae normal.  Cardiovascular:     Rate and Rhythm: Normal rate and regular rhythm.     Heart sounds: Normal heart sounds. No murmur heard. Pulmonary:     Effort: Pulmonary effort is normal. No respiratory distress.     Breath sounds: Normal breath sounds. No wheezing.  Skin:    General: Skin is warm and dry.      Findings: No rash.  Neurological:     Mental Status: She is alert and oriented to person, place, and time.     Coordination: Coordination normal.  Psychiatric:        Mood and Affect: Mood is anxious and depressed.        Behavior: Behavior normal.        Thought Content: Thought content does not include suicidal ideation. Thought content does not include suicidal plan.       Assessment & Plan:   Problem List Items Addressed This Visit       Other   Anxiety and depression   Relevant Medications   sertraline (ZOLOFT) 100 MG tablet    Continue the Zoloft, seems to be doing okay right now but may be want to increase but I am hesitant because of her symptoms every time we increase it.  We will wait till next time to increase the Follow up plan: Return in about 4 weeks (around 09/08/2021), or if symptoms worsen or fail to improve, for 3-4 week anxieyt and depression.  Counseling provided for all of the vaccine components No orders of the defined types were placed in this encounter.   09/10/2021, MD [facility] 08/11/2021, 4:28 PM

## 2021-09-10 ENCOUNTER — Ambulatory Visit: Payer: Medicaid Other | Admitting: Family Medicine

## 2021-09-23 ENCOUNTER — Ambulatory Visit: Payer: Medicaid Other | Admitting: Family Medicine

## 2021-09-23 ENCOUNTER — Encounter: Payer: Self-pay | Admitting: Family Medicine

## 2021-09-23 VITALS — BP 119/77 | HR 57 | Temp 97.9°F | Ht 64.0 in | Wt 212.0 lb

## 2021-09-23 DIAGNOSIS — F419 Anxiety disorder, unspecified: Secondary | ICD-10-CM | POA: Diagnosis not present

## 2021-09-23 DIAGNOSIS — F32A Depression, unspecified: Secondary | ICD-10-CM | POA: Diagnosis not present

## 2021-09-23 MED ORDER — BUPROPION HCL ER (XL) 150 MG PO TB24: 150.0000 mg | ORAL_TABLET | Freq: Every day | ORAL | 1 refills | Status: DC

## 2021-09-23 NOTE — Progress Notes (Signed)
BP 119/77   Pulse (!) 57   Temp 97.9 F (36.6 C)   Ht 5\' 4"  (1.626 m)   Wt 212 lb (96.2 kg)   SpO2 98%   BMI 36.39 kg/m    Subjective:   Patient ID: Amanda Lam, female    DOB: 01-06-1991, 31 y.o.   MRN: [id number]  HPI: Amanda Lam is a 31 y.o. female presenting on 09/23/2021 for Medical Management of Chronic Issues, Anxiety, and Depression   HPI Anxiety depression recheck Patient is coming in for anxiety depression recheck.  She is currently on sertraline 100 mg daily. She is not doing as well and has started to have thoughts fear of death again with her mother's passing and feels like her anxiousness is coming back and she feels like she is almost back to the beginning when she was picking anything.  She denies any suicidal ideations or thoughts of hurting self.    09/23/2021    2:02 PM 09/23/2021    2:01 PM 08/11/2021    4:06 PM 08/02/2021   10:17 AM 07/15/2021   11:06 AM  Depression screen PHQ 2/9  Decreased Interest  1 0 0 1  Down, Depressed, Hopeless  1 0 1 1  PHQ - 2 Score  2 0 1 2  Altered sleeping  1 0 0 3  Tired, decreased energy  3 2 1 3   Change in appetite  1 2 0 2  Feeling bad or failure about yourself   0 0 0 1  Trouble concentrating  0 0 0 1  Moving slowly or fidgety/restless  0 0 0 0  Suicidal thoughts  0 0 0 0  PHQ-9 Score  7 4 2 12   Difficult doing work/chores Somewhat difficult Not difficult at all Not difficult at all Somewhat difficult Somewhat difficult     Relevant past medical, surgical, family and social history reviewed and updated as indicated. Interim medical history since our last visit reviewed. Allergies and medications reviewed and updated.  Review of Systems  Constitutional:  Negative for chills and fever.  Eyes:  Negative for visual disturbance.  Respiratory:  Negative for chest tightness and shortness of breath.   Cardiovascular:  Negative for chest pain and leg swelling.  Skin:  Negative for rash.  Neurological:  Negative for  dizziness, light-headedness and headaches.  Psychiatric/Behavioral:  Positive for dysphoric mood. Negative for agitation, behavioral problems and sleep disturbance. The patient is nervous/anxious.   All other systems reviewed and are negative.   Per HPI unless specifically indicated above   Allergies as of 09/23/2021       Reactions   Penicillins Anaphylaxis        Medication List        Accurate as of September 23, 2021  2:31 PM. If you have any questions, ask your nurse or doctor.          buPROPion 150 MG 24 hr tablet Commonly known as: Wellbutrin XL Take 1 tablet (150 mg total) by mouth daily. Started by: , MD   omeprazole 20 MG capsule Commonly known as: PRILOSEC TAKE 1 CAPSULE BY MOUTH EVERY DAY   sertraline 100 MG tablet Commonly known as: ZOLOFT Take 1 tablet (100 mg total) by mouth daily.   Sronyx 0.1-20 MG-MCG tablet Generic drug: levonorgestrel-ethinyl estradiol Take 1 tablet by mouth daily.         Objective:   BP 119/77   Pulse (!) 57   Temp 97.9  F (36.6 C)   Ht 5\' 4"  (1.626 m)   Wt 212 lb (96.2 kg)   SpO2 98%   BMI 36.39 kg/m   Wt Readings from Last 3 Encounters:  09/23/21 212 lb (96.2 kg)  08/11/21 212 lb (96.2 kg)  08/02/21 213 lb 6.4 oz (96.8 kg)    Physical Exam Vitals and nursing note reviewed.  Constitutional:      General: She is not in acute distress.    Appearance: She is well-developed. She is not diaphoretic.  Eyes:     Conjunctiva/sclera: Conjunctivae normal.  Skin:    General: Skin is warm and dry.     Findings: Rash (Irritant dermatitis on bilateral lower extremities, recommended CeraVe a hydrocortisone) present.  Neurological:     Mental Status: She is alert and oriented to person, place, and time.     Coordination: Coordination normal.  Psychiatric:        Behavior: Behavior normal.       Assessment & Plan:   Problem List Items Addressed This Visit       Other   Anxiety and depression  - Primary   Relevant Medications   buPROPion (WELLBUTRIN XL) 150 MG 24 hr tablet    We will add Wellbutrin, continue the sertraline. Follow up plan: Return in about 4 weeks (around 10/21/2021), or if symptoms worsen or fail to improve, for Anxiety depression recheck.  Counseling provided for all of the vaccine components No orders of the defined types were placed in this encounter.   10/23/2021, MD [facility] 09/23/2021, 2:31 PM

## 2021-10-15 ENCOUNTER — Other Ambulatory Visit: Payer: Self-pay | Admitting: Family Medicine

## 2021-10-15 DIAGNOSIS — F32A Depression, unspecified: Secondary | ICD-10-CM

## 2021-10-22 ENCOUNTER — Ambulatory Visit: Payer: Medicaid Other | Admitting: Family Medicine

## 2021-11-08 ENCOUNTER — Other Ambulatory Visit: Payer: Self-pay | Admitting: Family Medicine

## 2021-11-08 DIAGNOSIS — F419 Anxiety disorder, unspecified: Secondary | ICD-10-CM

## 2021-11-12 ENCOUNTER — Encounter: Payer: Self-pay | Admitting: Family Medicine

## 2021-11-12 ENCOUNTER — Ambulatory Visit: Payer: Medicaid Other | Admitting: Family Medicine

## 2021-11-12 DIAGNOSIS — F419 Anxiety disorder, unspecified: Secondary | ICD-10-CM | POA: Diagnosis not present

## 2021-11-12 DIAGNOSIS — F32A Depression, unspecified: Secondary | ICD-10-CM | POA: Diagnosis not present

## 2021-11-12 DIAGNOSIS — F410 Panic disorder [episodic paroxysmal anxiety] without agoraphobia: Secondary | ICD-10-CM | POA: Diagnosis not present

## 2021-11-12 MED ORDER — SERTRALINE HCL 100 MG PO TABS: 100.0000 mg | ORAL_TABLET | Freq: Every day | ORAL | 1 refills | Status: DC

## 2021-11-12 MED ORDER — BUPROPION HCL ER (XL) 150 MG PO TB24: 150.0000 mg | ORAL_TABLET | Freq: Every day | ORAL | 1 refills | Status: DC

## 2021-11-12 NOTE — Progress Notes (Signed)
Virtual Visit via telephone Note  I connected with Amanda Lam on 11/12/21 at 1352 by telephone and verified that I am speaking with the correct person using two identifiers. Amanda Lam is currently located at home and patient are currently with her during visit. The provider, Elige Radon Julyanna Scholle, MD is located in their office at time of visit.  Call ended at 1508  I discussed the limitations, risks, security and privacy concerns of performing an evaluation and management service by telephone and the availability of in person appointments. I also discussed with the patient that there may be a patient responsible charge related to this service. The patient expressed understanding and agreed to proceed.   History and Present Illness: Patient is calling in for anxiety and depression recheck. Patient is feeling like things are going well and is happy with medicine.  She denies suicidal ideations. She is taking zoloft and wellbutrin. She feels like she is in a happy place.   1. Anxiety and depression   2. Panic attacks     Outpatient Encounter Medications as of 11/12/2021  Medication Sig   buPROPion (WELLBUTRIN XL) 150 MG 24 hr tablet Take 1 tablet (150 mg total) by mouth daily.   omeprazole (PRILOSEC) 20 MG capsule TAKE 1 CAPSULE BY MOUTH EVERY DAY   sertraline (ZOLOFT) 100 MG tablet Take 1 tablet (100 mg total) by mouth daily.   SRONYX 0.1-20 MG-MCG tablet Take 1 tablet by mouth daily.   [DISCONTINUED] buPROPion (WELLBUTRIN XL) 150 MG 24 hr tablet TAKE 1 TABLET BY MOUTH EVERY DAY   [DISCONTINUED] sertraline (ZOLOFT) 100 MG tablet Take 1 tablet (100 mg total) by mouth daily.   No facility-administered encounter medications on file as of 11/12/2021.    Review of Systems  Constitutional:  Negative for chills and fever.  Eyes:  Negative for visual disturbance.  Respiratory:  Negative for chest tightness and shortness of breath.   Cardiovascular:  Negative for chest pain and leg  swelling.  Skin:  Negative for rash.  Neurological:  Negative for dizziness, light-headedness and headaches.  Psychiatric/Behavioral:  Positive for dysphoric mood. Negative for agitation, behavioral problems, self-injury, sleep disturbance and suicidal ideas. The patient is nervous/anxious.   All other systems reviewed and are negative.   Observations/Objective: Patient sounds comfortable and in no acute distress  Assessment and Plan: Problem List Items Addressed This Visit       Other   Anxiety and depression - Primary   Relevant Medications   buPROPion (WELLBUTRIN XL) 150 MG 24 hr tablet   sertraline (ZOLOFT) 100 MG tablet   Panic attacks   Relevant Medications   buPROPion (WELLBUTRIN XL) 150 MG 24 hr tablet   sertraline (ZOLOFT) 100 MG tablet    She feels she is doing well, continue current medicine. Follow up plan: Return in about 3 months (around 02/11/2022), or if symptoms worsen or fail to improve, for Anxiety depression recheck.     I discussed the assessment and treatment plan with the patient. The patient was provided an opportunity to ask questions and all were answered. The patient agreed with the plan and demonstrated an understanding of the instructions.   The patient was advised to call back or seek an in-person evaluation if the symptoms worsen or if the condition fails to improve as anticipated.  The above assessment and management plan was discussed with the patient. The patient verbalized understanding of and has agreed to the management plan. Patient is aware to call  the clinic if symptoms persist or worsen. Patient is aware when to return to the clinic for a follow-up visit. Patient educated on when it is appropriate to go to the emergency department.    I provided 16 minutes of non-face-to-face time during this encounter.    Worthy Rancher, MD

## 2021-11-22 ENCOUNTER — Telehealth: Payer: Self-pay

## 2021-11-22 NOTE — Telephone Encounter (Signed)
Received a fax from the Access Nurse about question with medication since she missed her morning dose.  Attempted to contact patient and number not in service at this time.

## 2022-04-21 ENCOUNTER — Other Ambulatory Visit (HOSPITAL_COMMUNITY)
Admission: RE | Admit: 2022-04-21 | Discharge: 2022-04-21 | Disposition: A | Discharge status: Home / Self Care | Payer: Medicaid Other | Source: Ambulatory Visit | Attending: Family Medicine | Admitting: Family Medicine

## 2022-04-21 ENCOUNTER — Ambulatory Visit: Payer: Medicaid Other | Admitting: Family Medicine

## 2022-04-21 ENCOUNTER — Encounter: Payer: Self-pay | Admitting: Family Medicine

## 2022-04-21 VITALS — BP 115/78 | HR 87 | Temp 98.5°F | Ht 64.0 in | Wt 204.0 lb

## 2022-04-21 DIAGNOSIS — N898 Other specified noninflammatory disorders of vagina: Secondary | ICD-10-CM | POA: Insufficient documentation

## 2022-04-21 LAB — URINALYSIS, ROUTINE W REFLEX MICROSCOPIC
Bilirubin, UA: NEGATIVE
Glucose, UA: NEGATIVE
Ketones, UA: NEGATIVE
Leukocytes,UA: NEGATIVE
Nitrite, UA: NEGATIVE
Protein,UA: NEGATIVE
Specific Gravity, UA: 1.025 (ref 1.005–1.030)
Urobilinogen, Ur: 0.2 mg/dL (ref 0.2–1.0)
pH, UA: 5.5 (ref 5.0–7.5)

## 2022-04-21 LAB — MICROSCOPIC EXAMINATION
RBC, Urine: NONE SEEN /hpf (ref 0–2)
Renal Epithel, UA: NONE SEEN /hpf

## 2022-04-21 LAB — WET PREP FOR TRICH, YEAST, CLUE
Clue Cell Exam: NEGATIVE
Trichomonas Exam: NEGATIVE
Yeast Exam: NEGATIVE

## 2022-04-21 NOTE — Progress Notes (Signed)
Acute Office Visit  Subjective:  Patient ID: Amanda Lam, female    DOB: 02/18/1991, 32 y.o.   MRN: [id number]  Chief Complaint  Patient presents with   possible yeast infection    Vaginal Itching The patient's pertinent negatives include no vaginal discharge. Primary symptoms comment: "skin bumps". This is a recurrent problem. The current episode started 1 to 4 weeks ago. The problem occurs 2 to 4 times per day. The problem has been gradually improving. The pain is mild ("irritating). The problem affects both sides. She is not pregnant. Associated symptoms include back pain, constipation and urgency. Pertinent negatives include no fever or nausea. The symptoms are aggravated by urinating and intercourse. She has tried antifungals for the symptoms. The treatment provided mild relief. She is sexually active. No, her partner does not have an STD. She uses oral contraceptives for contraception. Her menstrual history has been regular. Her past medical history is significant for an STD. There is no history of an abdominal surgery, a Cesarean section, an ectopic pregnancy, endometriosis or herpes simplex.    Review of Systems  Constitutional:  Negative for fever.  Gastrointestinal:  Positive for constipation. Negative for nausea.  Genitourinary:  Positive for urgency. Negative for vaginal discharge.  Musculoskeletal:  Positive for back pain.    Objective:  BP 115/78   Pulse 87   Temp 98.5 F (36.9 C)   Ht '5\' 4"'$  (1.626 m)   Wt 204 lb (92.5 kg)   LMP 04/19/2022 (Exact Date)   SpO2 97%   BMI 35.02 kg/m    Physical Exam Constitutional:      General: She is not in acute distress.    Appearance: Normal appearance. She is not ill-appearing, toxic-appearing or diaphoretic.  Cardiovascular:     Rate and Rhythm: Normal rate.     Pulses: Normal pulses.     Heart sounds: Normal heart sounds. No murmur heard.    No gallop.  Pulmonary:     Effort: Pulmonary effort is normal. No  respiratory distress.     Breath sounds: Normal breath sounds. No stridor. No wheezing, rhonchi or rales.  Abdominal:     Tenderness: There is no right CVA tenderness or left CVA tenderness.  Skin:    General: Skin is warm.     Capillary Refill: Capillary refill takes less than 2 seconds.  Neurological:     General: No focal deficit present.     Mental Status: She is alert and oriented to person, place, and time. Mental status is at baseline.     Motor: No weakness.  Psychiatric:        Mood and Affect: Mood normal.        Behavior: Behavior normal.        Thought Content: Thought content normal.        Judgment: Judgment normal.   Pt declined assessment of GU.   Assessment & Plan:  1. Vaginal discharge 2. Vaginal itching Labs as below. Will communicate results to patient once available.  Negative for trich, yeast, and bacterial vaginosis in clinic today. Normal UA in clinic today (pt currently experiencing menses).  Pt treated for yeast June 2023, no recent treatments received.  Pt declined testing for HSV. Instructed pt that if results from below are negative and symptoms persist to return for testing for HSV.  - WET PREP FOR TRICH, YEAST, CLUE - Urinalysis, Routine w reflex microscopic - Urine Culture - HepB+HepC+HIV Panel - RPR - Urine cytology ancillary only  The above assessment and management plan was discussed with the patient. The patient verbalized understanding of and has agreed to the management plan using shared-decision making. Patient is aware to call the clinic if they develop any new symptoms or if symptoms fail to improve or worsen. Patient is aware when to return to the clinic for a follow-up visit. Patient educated on when it is appropriate to go to the emergency department.   Follow up PRN.   Donzetta Kohut, DNP-FNP [facility] [address] [place], [place] [address] [phone]

## 2022-04-22 LAB — HEPB+HEPC+HIV PANEL
HIV Screen 4th Generation wRfx: NONREACTIVE
Hep B C IgM: NEGATIVE
Hep B Core Total Ab: NEGATIVE
Hep B E Ab: NEGATIVE
Hep B E Ag: NEGATIVE
Hep B Surface Ab, Qual: REACTIVE
Hep C Virus Ab: NONREACTIVE
Hepatitis B Surface Ag: NEGATIVE

## 2022-04-22 LAB — RPR: RPR Ser Ql: NONREACTIVE

## 2022-04-25 LAB — URINE CULTURE

## 2022-04-25 LAB — URINE CYTOLOGY ANCILLARY ONLY
Chlamydia: NEGATIVE
Comment: NEGATIVE
Comment: NORMAL
Neisseria Gonorrhea: NEGATIVE

## 2022-05-09 ENCOUNTER — Ambulatory Visit: Payer: Medicaid Other | Admitting: Family Medicine

## 2022-05-10 ENCOUNTER — Encounter: Payer: Self-pay | Admitting: Family Medicine

## 2022-06-02 ENCOUNTER — Ambulatory Visit: Payer: Medicaid Other | Admitting: Family Medicine

## 2022-06-02 ENCOUNTER — Encounter: Payer: Self-pay | Admitting: Family Medicine

## 2022-06-02 VITALS — BP 126/81 | HR 85 | Ht 64.0 in | Wt 211.0 lb

## 2022-06-02 DIAGNOSIS — F32A Depression, unspecified: Secondary | ICD-10-CM

## 2022-06-02 DIAGNOSIS — F410 Panic disorder [episodic paroxysmal anxiety] without agoraphobia: Secondary | ICD-10-CM | POA: Diagnosis not present

## 2022-06-02 DIAGNOSIS — F419 Anxiety disorder, unspecified: Secondary | ICD-10-CM | POA: Diagnosis not present

## 2022-06-02 MED ORDER — BUPROPION HCL ER (XL) 300 MG PO TB24: 300.0000 mg | ORAL_TABLET | Freq: Every day | ORAL | 2 refills | Status: DC

## 2022-06-02 NOTE — Progress Notes (Signed)
BP 126/81   Pulse 85   Ht 5\' 4"  (1.626 m)   Wt 211 lb (95.7 kg)   SpO2 99%   BMI 36.22 kg/m    Subjective:   Patient ID: Amanda Lam, female    DOB: 10/04/1990, 32 y.o.   MRN: [id number]  HPI: [place] Amanda Lam is a 32 y.o. female presenting on 06/02/2022 for Medical Management of Chronic Issues, Anxiety, and Diabetes   HPI Anxiety depression recheck Patient is currently taking Wellbutrin 150 and Zoloft 100. She just feels like it is not going as well.  She currently lives a very stressful living situation where she after her mother died has been living with her father and has been having to help take care of his health.  She says her father is also been very disagreeable with her child's father and has caused multiple arguments over her family situation and the way she is raising her child and she just feels like it is overwhelming she has been feeling sad down depressed and crying a lot.  She denies any suicidal ideations or thoughts of hurting himself.  She says she is having trouble sleeping at night because of the anxiety as well.    06/02/2022    4:00 PM 04/21/2022   11:03 AM 09/23/2021    2:02 PM 09/23/2021    2:01 PM 08/11/2021    4:06 PM  Depression screen PHQ 2/9  Decreased Interest 1 1  1  0  Down, Depressed, Hopeless 2 1  1  0  PHQ - 2 Score 3 2  2  0  Altered sleeping 1 1  1  0  Tired, decreased energy 1 1  3 2   Change in appetite 1 1  1 2   Feeling bad or failure about yourself  1 0  0 0  Trouble concentrating 0 0  0 0  Moving slowly or fidgety/restless 0 0  0 0  Suicidal thoughts 0 0  0 0  PHQ-9 Score 7 5  7 4   Difficult doing work/chores Somewhat difficult Somewhat difficult Somewhat difficult Not difficult at all Not difficult at all     Relevant past medical, surgical, family and social history reviewed and updated as indicated. Interim medical history since our last visit reviewed. Allergies and medications reviewed and updated.  Review of Systems  Constitutional:   Negative for chills and fever.  HENT:  Negative for ear discharge.   Eyes:  Negative for visual disturbance.  Respiratory:  Negative for chest tightness and shortness of breath.   Cardiovascular:  Negative for chest pain and leg swelling.  Genitourinary:  Negative for difficulty urinating and dysuria.  Musculoskeletal:  Negative for back pain and gait problem.  Skin:  Negative for rash.  Neurological:  Negative for light-headedness and headaches.  Psychiatric/Behavioral:  Positive for dysphoric mood and sleep disturbance. Negative for agitation, behavioral problems, self-injury and suicidal ideas. The patient is nervous/anxious.   All other systems reviewed and are negative.   Per HPI unless specifically indicated above   Allergies as of 06/02/2022       Reactions   Penicillins Anaphylaxis        Medication List        Accurate as of June 02, 2022  4:35 PM. If you have any questions, ask your nurse or doctor.          acetaminophen 500 MG tablet Commonly known as: TYLENOL Take by mouth.   buPROPion 300 MG 24 hr tablet Commonly known  as: WELLBUTRIN XL Take 1 tablet (300 mg total) by mouth daily. What changed:  medication strength how much to take Changed by: Nils Pyle, MD   sertraline 100 MG tablet Commonly known as: ZOLOFT Take 1 tablet (100 mg total) by mouth daily.   Sronyx 0.1-20 MG-MCG tablet Generic drug: levonorgestrel-ethinyl estradiol Take 1 tablet by mouth daily.         Objective:   BP 126/81   Pulse 85   Ht 5\' 4"  (1.626 m)   Wt 211 lb (95.7 kg)   SpO2 99%   BMI 36.22 kg/m   Wt Readings from Last 3 Encounters:  06/02/22 211 lb (95.7 kg)  04/21/22 204 lb (92.5 kg)  09/23/21 212 lb (96.2 kg)    Physical Exam Vitals and nursing note reviewed.  Constitutional:      General: She is not in acute distress.    Appearance: She is well-developed. She is not diaphoretic.  Eyes:     Conjunctiva/sclera: Conjunctivae normal.   Cardiovascular:     Rate and Rhythm: Normal rate and regular rhythm.     Heart sounds: Normal heart sounds. No murmur heard. Pulmonary:     Effort: Pulmonary effort is normal. No respiratory distress.     Breath sounds: Normal breath sounds. No wheezing.  Skin:    General: Skin is warm and dry.     Findings: No rash.  Neurological:     Mental Status: She is alert and oriented to person, place, and time.     Coordination: Coordination normal.  Psychiatric:        Mood and Affect: Mood is anxious and depressed.        Behavior: Behavior normal.        Thought Content: Thought content does not include suicidal ideation. Thought content does not include suicidal plan.       Assessment & Plan:   Problem List Items Addressed This Visit       Other   Anxiety and depression - Primary   Relevant Medications   buPROPion (WELLBUTRIN XL) 300 MG 24 hr tablet   Panic attacks   Relevant Medications   buPROPion (WELLBUTRIN XL) 300 MG 24 hr tablet    Discussed the possibility of counseling in future and she will get set up with a counselor in our office in the future.  We will increase the Wellbutrin to 300 mg daily and continue with the Zoloft. Follow up plan: Return if symptoms worsen or fail to improve, for 1 month anxiety depression follow-up.  Counseling provided for all of the vaccine components No orders of the defined types were placed in this encounter.   Arville Care, MD [facility] 06/02/2022, 4:35 PM

## 2022-06-17 ENCOUNTER — Other Ambulatory Visit: Payer: Self-pay | Admitting: Family Medicine

## 2022-06-17 DIAGNOSIS — F419 Anxiety disorder, unspecified: Secondary | ICD-10-CM

## 2022-06-17 DIAGNOSIS — F410 Panic disorder [episodic paroxysmal anxiety] without agoraphobia: Secondary | ICD-10-CM

## 2022-06-30 ENCOUNTER — Other Ambulatory Visit: Payer: Self-pay | Admitting: Family Medicine

## 2022-06-30 DIAGNOSIS — F32A Depression, unspecified: Secondary | ICD-10-CM

## 2022-06-30 DIAGNOSIS — F410 Panic disorder [episodic paroxysmal anxiety] without agoraphobia: Secondary | ICD-10-CM

## 2022-07-13 ENCOUNTER — Ambulatory Visit: Payer: Medicaid Other | Admitting: Family Medicine

## 2022-07-22 ENCOUNTER — Encounter: Payer: Self-pay | Admitting: Family Medicine

## 2022-07-22 ENCOUNTER — Ambulatory Visit: Payer: Medicaid Other | Admitting: Family Medicine

## 2022-07-22 VITALS — BP 120/81 | HR 99 | Temp 97.7°F | Resp 20 | Ht 64.0 in | Wt 196.0 lb

## 2022-07-22 DIAGNOSIS — S39012A Strain of muscle, fascia and tendon of lower back, initial encounter: Secondary | ICD-10-CM | POA: Diagnosis not present

## 2022-07-22 DIAGNOSIS — L0291 Cutaneous abscess, unspecified: Secondary | ICD-10-CM | POA: Diagnosis not present

## 2022-07-22 MED ORDER — PREDNISONE 20 MG PO TABS
40.0000 mg | ORAL_TABLET | Freq: Every day | ORAL | 0 refills | Status: AC
Start: 2022-07-22 — End: 2022-07-27

## 2022-07-22 MED ORDER — SULFAMETHOXAZOLE-TRIMETHOPRIM 800-160 MG PO TABS
1.0000 | ORAL_TABLET | Freq: Two times a day (BID) | ORAL | 0 refills | Status: AC
Start: 2022-07-22 — End: 2022-07-29

## 2022-07-22 NOTE — Progress Notes (Signed)
Subjective:  Patient ID: Amanda Lam, female    DOB: May 29, 1990, 32 y.o.   MRN: [id number]  Patient Care Team: Dettinger, Elige Radon, MD as PCP - General (Family Medicine)   Chief Complaint:  Sore in nose, Nasal Congestion (Exposed to a bad smell at work yesterday and couldn't breathe good/), and Back Pain   HPI: Amanda Lam is a 32 y.o. female presenting on 07/22/2022 for Sore in nose, Nasal Congestion (Exposed to a bad smell at work yesterday and couldn't breathe good/), and Back Pain   Pt presents today for evaluation of a sore in her left nostril and back pain. States the sore in her left nostril has been there for several days and she can not seem to get it to heal. States it does form crusting / scabbing that she does pull out.  She also reports mid back pain. States she was moving something heavy at work and started having pain in her back. The pain is dull to cramping/sharp at times. Worse with movement such as twisting, bending, pulling, or lifting. No other associated symptoms. States she has tried motrin without relief of symptoms.   Back Pain This is a new problem. The current episode started in the past 7 days. The problem occurs intermittently. The problem has been waxing and waning since onset. The pain is present in the thoracic spine. The quality of the pain is described as aching, cramping and stabbing. The pain does not radiate. The pain is moderate. The symptoms are aggravated by twisting, bending and position. Pertinent negatives include no abdominal pain, bladder incontinence, bowel incontinence, chest pain, dysuria, fever, headaches, leg pain, numbness, paresis, paresthesias, pelvic pain, perianal numbness, tingling, weakness or weight loss. She has tried NSAIDs for the symptoms. The treatment provided no relief.     Relevant past medical, surgical, family, and social history reviewed and updated as indicated.  Allergies and medications reviewed and updated. Data  reviewed: Chart in Epic.   Past Medical History:  Diagnosis Date   Asthma     History reviewed. No pertinent surgical history.  Social History   Socioeconomic History   Marital status: Single    Spouse name: Not on file   Number of children: Not on file   Years of education: Not on file   Highest education level: 12th grade  Occupational History   Not on file  Tobacco Use   Smoking status: Former    Packs/day: 0.50    Years: 7.00    Additional pack years: 0.00    Total pack years: 3.50    Types: Cigarettes   Smokeless tobacco: Never  Vaping Use   Vaping Use: Never used  Substance and Sexual Activity   Alcohol use: No   Drug use: No   Sexual activity: Not Currently  Other Topics Concern   Not on file  Social History Narrative   Not on file   Social Determinants of Health   Financial Resource Strain: Medium Risk (05/29/2022)   Overall Financial Resource Strain (CARDIA)    Difficulty of Paying Living Expenses: Somewhat hard  Food Insecurity: No Food Insecurity (05/29/2022)   Hunger Vital Sign    Worried About Running Out of Food in the Last Year: Never true    Ran Out of Food in the Last Year: Never true  Transportation Needs: No Transportation Needs (05/29/2022)   PRAPARE - Administrator, Civil Service (Medical): No    Lack of Transportation (  Non-Medical): No  Physical Activity: Insufficiently Active (05/29/2022)   Exercise Vital Sign    Days of Exercise per Week: 4 days    Minutes of Exercise per Session: 20 min  Stress: Stress Concern Present (05/29/2022)   [employer] of Occupational Health - Occupational Stress Questionnaire    Feeling of Stress : Rather much  Social Connections: Socially Isolated (05/29/2022)   Social Connection and Isolation Panel [NHANES]    Frequency of Communication with Friends and Family: Three times a week    Frequency of Social Gatherings with Friends and Family: Once a week    Attends Religious Services: Never     Database administrator or Organizations: No    Attends Banker Meetings: Not on file    Marital Status: Never married  Intimate Partner Violence: Not on file    Outpatient Encounter Medications as of 07/22/2022  Medication Sig   acetaminophen (TYLENOL) 500 MG tablet Take by mouth.   buPROPion (WELLBUTRIN XL) 300 MG 24 hr tablet TAKE 1 TABLET BY MOUTH EVERY DAY   phentermine (ADIPEX-P) 37.5 MG tablet Take 37.5 mg by mouth every morning.   predniSONE (DELTASONE) 20 MG tablet Take 2 tablets (40 mg total) by mouth daily with breakfast for 5 days. 2 po daily for 5 days   SEMAGLUTIDE PO Take by mouth.   sertraline (ZOLOFT) 100 MG tablet TAKE 1 TABLET BY MOUTH EVERY DAY   SRONYX 0.1-20 MG-MCG tablet Take 1 tablet by mouth daily.   sulfamethoxazole-trimethoprim (BACTRIM DS) 800-160 MG tablet Take 1 tablet by mouth 2 (two) times daily for 7 days.   No facility-administered encounter medications on file as of 07/22/2022.    Allergies  Allergen Reactions   Penicillins Anaphylaxis    Review of Systems  Constitutional:  Negative for activity change, appetite change, chills, diaphoresis, fatigue, fever, unexpected weight change and weight loss.  HENT: Negative.    Eyes: Negative.  Negative for photophobia and visual disturbance.  Respiratory:  Negative for cough, chest tightness and shortness of breath.   Cardiovascular:  Negative for chest pain, palpitations and leg swelling.  Gastrointestinal:  Negative for abdominal pain, blood in stool, bowel incontinence, constipation, diarrhea, nausea and vomiting.  Endocrine: Negative.   Genitourinary:  Negative for bladder incontinence, difficulty urinating, dysuria, frequency, pelvic pain and urgency.  Musculoskeletal:  Positive for back pain and myalgias. Negative for arthralgias, gait problem, joint swelling, neck pain and neck stiffness.  Skin:  Positive for wound. Negative for color change, pallor and rash.  Allergic/Immunologic:  Negative.   Neurological:  Negative for dizziness, tingling, tremors, seizures, syncope, facial asymmetry, speech difficulty, weakness, light-headedness, numbness, headaches and paresthesias.  Hematological: Negative.   Psychiatric/Behavioral:  Negative for confusion, hallucinations, sleep disturbance and suicidal ideas.   All other systems reviewed and are negative.       Objective:  BP 120/81   Pulse 99   Temp 97.7 F (36.5 C) (Temporal)   Resp 20   Ht 5\' 4"  (1.626 m)   Wt 196 lb (88.9 kg)   SpO2 97%   BMI 33.64 kg/m    Wt Readings from Last 3 Encounters:  07/22/22 196 lb (88.9 kg)  06/02/22 211 lb (95.7 kg)  04/21/22 204 lb (92.5 kg)    Physical Exam Vitals and nursing note reviewed.  Constitutional:      General: She is not in acute distress.    Appearance: Normal appearance. She is well-developed and well-groomed. She is obese. She is not  ill-appearing, toxic-appearing or diaphoretic.  HENT:     Head: Normocephalic and atraumatic.     Jaw: There is normal jaw occlusion.     Right Ear: Hearing, tympanic membrane, ear canal and external ear normal.     Left Ear: Hearing, tympanic membrane, ear canal and external ear normal.     Nose: Nose normal.     Comments: Abscess in left nostril    Mouth/Throat:     Lips: Pink.     Mouth: Mucous membranes are moist.     Pharynx: Oropharynx is clear. Uvula midline.  Eyes:     General: Lids are normal.     Extraocular Movements: Extraocular movements intact.     Conjunctiva/sclera: Conjunctivae normal.     Pupils: Pupils are equal, round, and reactive to light.  Neck:     Thyroid: No thyroid mass, thyromegaly or thyroid tenderness.     Vascular: No carotid bruit or JVD.     Trachea: Trachea and phonation normal.  Cardiovascular:     Rate and Rhythm: Normal rate and regular rhythm.     Chest Wall: PMI is not displaced.     Pulses: Normal pulses.     Heart sounds: Normal heart sounds. No murmur heard.    No friction rub.  No gallop.  Pulmonary:     Effort: Pulmonary effort is normal. No respiratory distress.     Breath sounds: Normal breath sounds. No stridor. No wheezing, rhonchi or rales.  Chest:     Chest wall: No tenderness.  Abdominal:     General: Bowel sounds are normal. There is no distension or abdominal bruit.     Palpations: Abdomen is soft. There is no hepatomegaly or splenomegaly.     Tenderness: There is no abdominal tenderness. There is no right CVA tenderness or left CVA tenderness.     Hernia: No hernia is present.  Musculoskeletal:        General: Normal range of motion.     Cervical back: Normal, normal range of motion and neck supple.     Thoracic back: Spasms and tenderness present. No swelling, edema, deformity, signs of trauma, lacerations or bony tenderness. Normal range of motion. No scoliosis.     Lumbar back: Normal.       Back:     Right lower leg: No edema.     Left lower leg: No edema.  Lymphadenopathy:     Cervical: No cervical adenopathy.  Skin:    General: Skin is warm and dry.     Capillary Refill: Capillary refill takes less than 2 seconds.     Coloration: Skin is not cyanotic, jaundiced or pale.     Findings: No rash.  Neurological:     General: No focal deficit present.     Mental Status: She is alert and oriented to person, place, and time.     Sensory: Sensation is intact.     Motor: Motor function is intact.     Coordination: Coordination is intact.     Gait: Gait is intact.     Deep Tendon Reflexes: Reflexes are normal and symmetric.  Psychiatric:        Attention and Perception: Attention and perception normal.        Mood and Affect: Mood and affect normal.        Speech: Speech normal.        Behavior: Behavior normal. Behavior is cooperative.        Thought Content: Thought content  normal.        Cognition and Memory: Cognition and memory normal.        Judgment: Judgment normal.     Results for orders placed or performed in visit on 04/21/22   WET PREP FOR TRICH, YEAST, CLUE   Specimen: Vaginal Fluid   Vaginal Flui  Result Value Ref Range   Trichomonas Exam Negative Negative   Yeast Exam Negative Negative   Clue Cell Exam Negative Negative  Urine Culture   Specimen: Urine   UR  Result Value Ref Range   Urine Culture, Routine Final report    Organism ID, Bacteria Comment   Microscopic Examination   Urine  Result Value Ref Range   WBC, UA 0-5 0 - 5 /hpf   RBC, Urine None seen 0 - 2 /hpf   Epithelial Cells (non renal) 0-10 0 - 10 /hpf   Renal Epithel, UA None seen None seen /hpf   Bacteria, UA Few (A) None seen/Few  Urinalysis, Routine w reflex microscopic  Result Value Ref Range   Specific Gravity, UA 1.025 1.005 - 1.030   pH, UA 5.5 5.0 - 7.5   Color, UA Yellow Yellow   Appearance Ur Clear Clear   Leukocytes,UA Negative Negative   Protein,UA Negative Negative/Trace   Glucose, UA Negative Negative   Ketones, UA Negative Negative   RBC, UA 1+ (A) Negative   Bilirubin, UA Negative Negative   Urobilinogen, Ur 0.2 0.2 - 1.0 mg/dL   Nitrite, UA Negative Negative   Microscopic Examination See below:   HepB+HepC+HIV Panel  Result Value Ref Range   Hepatitis B Surface Ag Negative Negative   Hep B E Ag Negative Negative   Hep B C IgM Negative Negative   Hep B Core Total Ab Negative Negative   Hep B E Ab Negative Negative   Hep B Surface Ab, Qual Reactive    Hep C Virus Ab Non Reactive Non Reactive   HIV Screen 4th Generation wRfx Non Reactive Non Reactive  RPR  Result Value Ref Range   RPR Ser Ql Non Reactive Non Reactive  Urine cytology ancillary only  Result Value Ref Range   Neisseria Gonorrhea Negative    Chlamydia Negative    Comment Normal Reference Ranger Chlamydia - Negative    Comment      Normal Reference Range Neisseria Gonorrhea - Negative       Pertinent labs & imaging results that were available during my care of the patient were reviewed by me and considered in my medical decision  making.  Assessment & Plan:  Nataly was seen today for sore in nose, nasal congestion and back pain.  Diagnoses and all orders for this visit:  Back strain, initial encounter Mid back strain from heavy lifting at work. No red flags present. Aware to take below for antiinflammatory properties. Aware of symptomatic care at home. Report new, worsening, or persistent symptoms.  -     predniSONE (DELTASONE) 20 MG tablet; Take 2 tablets (40 mg total) by mouth daily with breakfast for 5 days. 2 po daily for 5 days  Abscess Abscess in left nostril. Will treat with below. Pt aware to report new, worsening, or persistent symptoms. Aware not to pick at area. -     sulfamethoxazole-trimethoprim (BACTRIM DS) 800-160 MG tablet; Take 1 tablet by mouth 2 (two) times daily for 7 days.     Continue all other maintenance medications.  Follow up plan: Return if symptoms worsen or fail to  improve.   Continue healthy lifestyle choices, including diet (rich in fruits, vegetables, and lean proteins, and low in salt and simple carbohydrates) and exercise (at least 30 minutes of moderate physical activity daily).  Educational handout given for back strain  The above assessment and management plan was discussed with the patient. The patient verbalized understanding of and has agreed to the management plan. Patient is aware to call the clinic if they develop any new symptoms or if symptoms persist or worsen. Patient is aware when to return to the clinic for a follow-up visit. Patient educated on when it is appropriate to go to the emergency department.   Kari Baars, FNP-C [facility] [phone]

## 2022-08-08 ENCOUNTER — Telehealth: Payer: Self-pay | Admitting: Family Medicine

## 2022-08-08 NOTE — Telephone Encounter (Signed)
Yes that is fine for Wednesday to be a 4-week follow-up virtual visit but do make sure she knows as well that she is needed to be scheduled for blood work and a physical at some point because it has been more than a year in the future.

## 2022-08-08 NOTE — Telephone Encounter (Signed)
Left message making aware of all. Advised to call back with concerns. Appt changed to virtual.

## 2022-08-08 NOTE — Telephone Encounter (Signed)
Dr. Louanne Skye, this appt is for a 4w follow up for depression

## 2022-08-10 ENCOUNTER — Telehealth: Payer: Medicaid Other | Admitting: Family Medicine

## 2022-08-10 ENCOUNTER — Encounter: Payer: Self-pay | Admitting: Family Medicine

## 2022-08-10 DIAGNOSIS — F32A Depression, unspecified: Secondary | ICD-10-CM

## 2022-08-10 DIAGNOSIS — F419 Anxiety disorder, unspecified: Secondary | ICD-10-CM

## 2022-08-10 DIAGNOSIS — F410 Panic disorder [episodic paroxysmal anxiety] without agoraphobia: Secondary | ICD-10-CM | POA: Diagnosis not present

## 2022-08-10 MED ORDER — BUPROPION HCL ER (XL) 300 MG PO TB24: 300.0000 mg | ORAL_TABLET | Freq: Every day | ORAL | 1 refills | Status: DC

## 2022-08-10 MED ORDER — SERTRALINE HCL 100 MG PO TABS: 100.0000 mg | ORAL_TABLET | Freq: Every day | ORAL | 1 refills | Status: DC

## 2022-08-10 NOTE — Progress Notes (Signed)
Virtual Visit via MyChart video note  I connected with Amanda Lam on 08/10/22 at 0848 by video and verified that I am speaking with the correct person using two identifiers. Amanda Lam is currently located at home and patient are currently with her during visit. The provider, Elige Radon Evalette Montrose, MD is located in their office at time of visit.  Call ended at 0855  I discussed the limitations, risks, security and privacy concerns of performing an evaluation and management service by video and the availability of in person appointments. I also discussed with the patient that there may be a patient responsible charge related to this service. The patient expressed understanding and agreed to proceed.   History and Present Illness: Depression and anxiety Patient is calling in for anxiety and depression.  She had her father pass away and she is dealing with estate and lawyers. She is sleeping at night but she still has some tiredness more recently. She is taking semaglutide once per week with [facility]. Denies suicidal ideations or thoughts of hurting herself.  She feels like her depression and anxiety medicines have done a lot to help her through all of the stuff that is going on recently with her father's passing away and dealing with the lawyers.  She says her only real complaint is that she has been more tired and more fatigued.  She denies snoring.  She says she is sleeping at night.  1. Panic attacks   2. Anxiety and depression     Outpatient Encounter Medications as of 08/10/2022  Medication Sig   acetaminophen (TYLENOL) 500 MG tablet Take by mouth.   buPROPion (WELLBUTRIN XL) 300 MG 24 hr tablet Take 1 tablet (300 mg total) by mouth daily.   phentermine (ADIPEX-P) 37.5 MG tablet Take 37.5 mg by mouth every morning.   SEMAGLUTIDE PO Take by mouth.   sertraline (ZOLOFT) 100 MG tablet Take 1 tablet (100 mg total) by mouth daily.   SRONYX 0.1-20 MG-MCG tablet Take 1 tablet by mouth  daily.   [DISCONTINUED] buPROPion (WELLBUTRIN XL) 300 MG 24 hr tablet TAKE 1 TABLET BY MOUTH EVERY DAY   [DISCONTINUED] sertraline (ZOLOFT) 100 MG tablet TAKE 1 TABLET BY MOUTH EVERY DAY   No facility-administered encounter medications on file as of 08/10/2022.    Review of Systems  Constitutional:  Positive for fatigue. Negative for chills and fever.  Eyes:  Negative for visual disturbance.  Respiratory:  Negative for chest tightness and shortness of breath.   Cardiovascular:  Negative for chest pain and leg swelling.  Genitourinary:  Negative for difficulty urinating and dysuria.  Musculoskeletal:  Negative for back pain and gait problem.  Skin:  Negative for rash.  Neurological:  Negative for dizziness, light-headedness and headaches.  Psychiatric/Behavioral:  Positive for dysphoric mood. Negative for agitation, behavioral problems, self-injury and suicidal ideas. The patient is nervous/anxious.   All other systems reviewed and are negative.   Observations/Objective: Patient sounds comfortable and in no acute distress  Assessment and Plan: Problem List Items Addressed This Visit       Other   Anxiety and depression   Relevant Medications   buPROPion (WELLBUTRIN XL) 300 MG 24 hr tablet   sertraline (ZOLOFT) 100 MG tablet   Panic attacks   Relevant Medications   buPROPion (WELLBUTRIN XL) 300 MG 24 hr tablet   sertraline (ZOLOFT) 100 MG tablet    Continue Wellbutrin and Zoloft. Follow up plan: Return in about 3 months (around  11/10/2022), or if symptoms worsen or fail to improve, for physical and depression.     I discussed the assessment and treatment plan with the patient. The patient was provided an opportunity to ask questions and all were answered. The patient agreed with the plan and demonstrated an understanding of the instructions.   The patient was advised to call back or seek an in-person evaluation if the symptoms worsen or if the condition fails to improve as  anticipated.  The above assessment and management plan was discussed with the patient. The patient verbalized understanding of and has agreed to the management plan. Patient is aware to call the clinic if symptoms persist or worsen. Patient is aware when to return to the clinic for a follow-up visit. Patient educated on when it is appropriate to go to the emergency department.    I provided 7 minutes of non-face-to-face time during this encounter.    Nils Pyle, MD

## 2022-08-22 ENCOUNTER — Telehealth: Payer: Self-pay | Admitting: Family Medicine

## 2022-08-22 NOTE — Telephone Encounter (Signed)
Please advise, historical med, no PE w/ PCP here

## 2022-08-22 NOTE — Telephone Encounter (Signed)
  Prescription Request  08/22/2022  Is this a "Controlled Substance" medicine? no  Have you seen your PCP in the last 2 weeks? no  If YES, route message to pool  -  If NO, patient needs to be scheduled for appointment.  What is the name of the medication or equipment? Sronyx 0.1/20 mg. (Birth Control) Patient lost her RX and OBGYN asked her to call her PCP to see if he could call it in. Can't get in for physical with OBGYN til September.  Have you contacted your pharmacy to request a refill? no   Which [facility]   Patient notified that their request is being sent to the clinical staff for review and that they should receive a response within 2 business days.

## 2022-08-24 ENCOUNTER — Other Ambulatory Visit: Payer: Self-pay | Admitting: Family Medicine

## 2022-08-24 MED ORDER — SRONYX 0.1-20 MG-MCG PO TABS: 1.0000 | ORAL_TABLET | Freq: Every day | ORAL | 4 refills | Status: DC

## 2022-08-24 NOTE — Progress Notes (Unsigned)
I sent in a birth control prescription for her, sometimes when you lose prescription you might have to pay for 1 month out-of-pocket because insurance might not cover it early but call the pharmacy and ask them

## 2022-08-24 NOTE — Progress Notes (Signed)
Left detailed message on patients voicemail with Dr Oris Drone comments

## 2022-09-02 ENCOUNTER — Ambulatory Visit: Payer: Medicaid Other | Admitting: Family Medicine

## 2022-09-02 ENCOUNTER — Encounter: Payer: Self-pay | Admitting: Family Medicine

## 2022-09-02 VITALS — BP 120/82 | HR 75 | Temp 98.2°F | Ht 64.0 in | Wt 192.2 lb

## 2022-09-02 DIAGNOSIS — L0291 Cutaneous abscess, unspecified: Secondary | ICD-10-CM

## 2022-09-02 DIAGNOSIS — Z8742 Personal history of other diseases of the female genital tract: Secondary | ICD-10-CM

## 2022-09-02 MED ORDER — SULFAMETHOXAZOLE-TRIMETHOPRIM 800-160 MG PO TABS
1.0000 | ORAL_TABLET | Freq: Two times a day (BID) | ORAL | 0 refills | Status: AC
Start: 2022-09-02 — End: 2022-09-16

## 2022-09-02 MED ORDER — BACITRACIN 500 UNIT/GM EX OINT
1.0000 | TOPICAL_OINTMENT | Freq: Two times a day (BID) | CUTANEOUS | 0 refills | Status: DC
Start: 2022-09-02 — End: 2023-07-03

## 2022-09-02 NOTE — Progress Notes (Signed)
Subjective:  Patient ID: Amanda Lam, female    DOB: 1990/12/25, 32 y.o.   MRN: [id number]  Patient Care Team: Dettinger, Elige Radon, MD as PCP - General (Family Medicine)   Chief Complaint:  Sore in Nose   HPI: Amanda Lam is a 32 y.o. female presenting on 09/02/2022 for Sore in Nose   Pt presents today for recurrent sores in her nose. She had one previously and was treated with bactrim which helped. The new sores have been present for over a week.      Relevant past medical, surgical, family, and social history reviewed and updated as indicated.  Allergies and medications reviewed and updated. Data reviewed: Chart in Epic.   Past Medical History:  Diagnosis Date   Asthma     History reviewed. No pertinent surgical history.  Social History   Socioeconomic History   Marital status: Single    Spouse name: Not on file   Number of children: Not on file   Years of education: Not on file   Highest education level: 12th grade  Occupational History   Not on file  Tobacco Use   Smoking status: Former    Current packs/day: 0.50    Average packs/day: 0.5 packs/day for 7.0 years (3.5 ttl pk-yrs)    Types: Cigarettes   Smokeless tobacco: Never  Vaping Use   Vaping status: Never Used  Substance and Sexual Activity   Alcohol use: No   Drug use: No   Sexual activity: Not Currently  Other Topics Concern   Not on file  Social History Narrative   Not on file   Social Determinants of Health   Financial Resource Strain: Medium Risk (05/29/2022)   Overall Financial Resource Strain (CARDIA)    Difficulty of Paying Living Expenses: Somewhat hard  Food Insecurity: No Food Insecurity (05/29/2022)   Hunger Vital Sign    Worried About Running Out of Food in the Last Year: Never true    Ran Out of Food in the Last Year: Never true  Transportation Needs: No Transportation Needs (05/29/2022)   PRAPARE - Administrator, Civil Service (Medical): No    Lack of  Transportation (Non-Medical): No  Physical Activity: Insufficiently Active (05/29/2022)   Exercise Vital Sign    Days of Exercise per Week: 4 days    Minutes of Exercise per Session: 20 min  Stress: Stress Concern Present (05/29/2022)   [employer] of Occupational Health - Occupational Stress Questionnaire    Feeling of Stress : Rather much  Social Connections: Socially Isolated (05/29/2022)   Social Connection and Isolation Panel [NHANES]    Frequency of Communication with Friends and Family: Three times a week    Frequency of Social Gatherings with Friends and Family: Once a week    Attends Religious Services: Never    Database administrator or Organizations: No    Attends Engineer, structural: Not on file    Marital Status: Never married  Intimate Partner Violence: Not At Risk (03/14/2022)   Received from [facility]   HITS    Over the last 12 months how often did your partner physically hurt you?: 1    Over the last 12 months how often did your partner insult you or talk down to you?: 1    Over the last 12 months how often did your partner threaten you with physical harm?: 1    Over the last 12 months how  often did your partner scream or curse at you?: 1    Outpatient Encounter Medications as of 09/02/2022  Medication Sig   acetaminophen (TYLENOL) 500 MG tablet Take by mouth.   bacitracin 500 UNIT/GM ointment Apply 1 Application topically 2 (two) times daily.   buPROPion (WELLBUTRIN XL) 300 MG 24 hr tablet Take 1 tablet (300 mg total) by mouth daily.   sertraline (ZOLOFT) 100 MG tablet Take 1 tablet (100 mg total) by mouth daily.   SRONYX 0.1-20 MG-MCG tablet Take 1 tablet by mouth daily.   sulfamethoxazole-trimethoprim (BACTRIM DS) 800-160 MG tablet Take 1 tablet by mouth 2 (two) times daily for 14 days.   [DISCONTINUED] phentermine (ADIPEX-P) 37.5 MG tablet Take 37.5 mg by mouth every morning. (Patient not taking: Reported on 09/02/2022)   [DISCONTINUED]  SEMAGLUTIDE PO Take by mouth. (Patient not taking: Reported on 09/02/2022)   No facility-administered encounter medications on file as of 09/02/2022.    Allergies  Allergen Reactions   Penicillins Anaphylaxis    Review of Systems  Constitutional:  Negative for activity change, appetite change, chills, diaphoresis, fatigue, fever and unexpected weight change.  HENT: Negative.    Eyes: Negative.  Negative for photophobia and visual disturbance.  Respiratory:  Negative for cough, chest tightness and shortness of breath.   Cardiovascular:  Negative for chest pain, palpitations and leg swelling.  Gastrointestinal:  Negative for abdominal pain, blood in stool, constipation, diarrhea, nausea and vomiting.  Endocrine: Negative.   Genitourinary:  Negative for decreased urine volume, difficulty urinating, dysuria, frequency and urgency.  Musculoskeletal:  Negative for arthralgias and myalgias.  Skin:  Positive for color change and wound.  Allergic/Immunologic: Negative.   Neurological:  Negative for dizziness and headaches.  Hematological: Negative.   Psychiatric/Behavioral:  Negative for confusion, hallucinations, sleep disturbance and suicidal ideas.   All other systems reviewed and are negative.       Objective:  BP 120/82   Pulse 75   Temp 98.2 F (36.8 C) (Temporal)   Ht 5\' 4"  (1.626 m)   Wt 192 lb 3.2 oz (87.2 kg)   LMP 08/19/2022   SpO2 98%   BMI 32.99 kg/m    Wt Readings from Last 3 Encounters:  09/02/22 192 lb 3.2 oz (87.2 kg)  07/22/22 196 lb (88.9 kg)  06/02/22 211 lb (95.7 kg)    Physical Exam Vitals and nursing note reviewed.  Constitutional:      General: She is not in acute distress.    Appearance: Normal appearance. She is obese. She is not ill-appearing, toxic-appearing or diaphoretic.  HENT:     Head: Normocephalic and atraumatic.     Nose:     Comments: Abscess to left and right nare    Mouth/Throat:     Mouth: Mucous membranes are moist.  Eyes:      Pupils: Pupils are equal, round, and reactive to light.  Cardiovascular:     Rate and Rhythm: Normal rate.  Pulmonary:     Effort: Pulmonary effort is normal.  Musculoskeletal:     Right lower leg: No edema.     Left lower leg: No edema.  Skin:    General: Skin is warm and dry.     Capillary Refill: Capillary refill takes less than 2 seconds.  Neurological:     General: No focal deficit present.     Mental Status: She is alert and oriented to person, place, and time.  Psychiatric:        Mood and Affect:  Mood normal.        Behavior: Behavior normal.        Thought Content: Thought content normal.        Judgment: Judgment normal.     Results for orders placed or performed in visit on 04/21/22  WET PREP FOR TRICH, YEAST, CLUE   Specimen: Vaginal Fluid   Vaginal Flui  Result Value Ref Range   Trichomonas Exam Negative Negative   Yeast Exam Negative Negative   Clue Cell Exam Negative Negative  Urine Culture   Specimen: Urine   UR  Result Value Ref Range   Urine Culture, Routine Final report    Organism ID, Bacteria Comment   Microscopic Examination   Urine  Result Value Ref Range   WBC, UA 0-5 0 - 5 /hpf   RBC, Urine None seen 0 - 2 /hpf   Epithelial Cells (non renal) 0-10 0 - 10 /hpf   Renal Epithel, UA None seen None seen /hpf   Bacteria, UA Few (A) None seen/Few  Urinalysis, Routine w reflex microscopic  Result Value Ref Range   Specific Gravity, UA 1.025 1.005 - 1.030   pH, UA 5.5 5.0 - 7.5   Color, UA Yellow Yellow   Appearance Ur Clear Clear   Leukocytes,UA Negative Negative   Protein,UA Negative Negative/Trace   Glucose, UA Negative Negative   Ketones, UA Negative Negative   RBC, UA 1+ (A) Negative   Bilirubin, UA Negative Negative   Urobilinogen, Ur 0.2 0.2 - 1.0 mg/dL   Nitrite, UA Negative Negative   Microscopic Examination See below:   HepB+HepC+HIV Panel  Result Value Ref Range   Hepatitis B Surface Ag Negative Negative   Hep B E Ag Negative  Negative   Hep B C IgM Negative Negative   Hep B Core Total Ab Negative Negative   Hep B E Ab Negative Negative   Hep B Surface Ab, Qual Reactive    Hep C Virus Ab Non Reactive Non Reactive   HIV Screen 4th Generation wRfx Non Reactive Non Reactive  RPR  Result Value Ref Range   RPR Ser Ql Non Reactive Non Reactive  Urine cytology ancillary only  Result Value Ref Range   Neisseria Gonorrhea Negative    Chlamydia Negative    Comment Normal Reference Ranger Chlamydia - Negative    Comment      Normal Reference Range Neisseria Gonorrhea - Negative       Pertinent labs & imaging results that were available during my care of the patient were reviewed by me and considered in my medical decision making.  Assessment & Plan:  Tonnette was seen today for sore in nose.  Diagnoses and all orders for this visit:  Abscess Recurrent abscess to nostril. Will treat with 14 days of bactrim and topical bacitracin. Pt aware of wound care and preventative measures. Report new, worsening, or persistent symptoms.  -     sulfamethoxazole-trimethoprim (BACTRIM DS) 800-160 MG tablet; Take 1 tablet by mouth 2 (two) times daily for 14 days. -     bacitracin 500 UNIT/GM ointment; Apply 1 Application topically 2 (two) times daily.     Continue all other maintenance medications.  Follow up plan: Return if symptoms worsen or fail to improve.   Continue healthy lifestyle choices, including diet (rich in fruits, vegetables, and lean proteins, and low in salt and simple carbohydrates) and exercise (at least 30 minutes of moderate physical activity daily).  Educational handout given for skin abscess  The above assessment and management plan was discussed with the patient. The patient verbalized understanding of and has agreed to the management plan. Patient is aware to call the clinic if they develop any new symptoms or if symptoms persist or worsen. Patient is aware when to return to the clinic for a  follow-up visit. Patient educated on when it is appropriate to go to the emergency department.   Kari Baars, FNP-C [facility] [phone]

## 2022-09-07 ENCOUNTER — Telehealth: Payer: Self-pay | Admitting: Family Medicine

## 2022-09-07 NOTE — Telephone Encounter (Signed)
Patient was prescribed antibiotic on 7/12 and said she thinks it may cause a yeast infection, wants to know if something can be called in. Please call back and advise.    If possible, [facility] to  [id number] [place], [place] - [address]

## 2022-09-08 MED ORDER — FLUCONAZOLE 150 MG PO TABS
150.0000 mg | ORAL_TABLET | Freq: Once | ORAL | 0 refills | Status: AC
Start: 2022-09-08 — End: 2022-09-08

## 2022-09-08 NOTE — Addendum Note (Signed)
Addended by: Sonny Masters on: 09/08/2022 01:13 PM   Modules accepted: Orders

## 2022-09-08 NOTE — Telephone Encounter (Signed)
Medication sent to pharmacy  

## 2022-09-13 ENCOUNTER — Other Ambulatory Visit: Payer: Self-pay | Admitting: Family Medicine

## 2022-11-10 ENCOUNTER — Encounter: Payer: Medicaid Other | Admitting: Family Medicine

## 2022-12-17 ENCOUNTER — Other Ambulatory Visit: Payer: Self-pay | Admitting: Family Medicine

## 2022-12-19 ENCOUNTER — Telehealth (INDEPENDENT_AMBULATORY_CARE_PROVIDER_SITE_OTHER): Payer: Medicaid Other | Admitting: Family Medicine

## 2022-12-19 NOTE — Progress Notes (Signed)
Patient cancelled appt.

## 2022-12-30 NOTE — Telephone Encounter (Unsigned)
Copied from CRM [id number]. Topic: Clinical - Medical Advice >> Dec 30, 2022  9:02 AM Herbert Seta B wrote: Reason for CRM: Patient recently prescribed steroids, experiencing symptoms of Yeast Infection, would like to speak with provider about possible RX

## 2023-01-02 ENCOUNTER — Other Ambulatory Visit (HOSPITAL_COMMUNITY)
Admission: RE | Admit: 2023-01-02 | Discharge: 2023-01-02 | Disposition: A | Discharge status: Home / Self Care | Payer: Medicaid Other | Source: Ambulatory Visit | Attending: Family Medicine | Admitting: Family Medicine

## 2023-01-02 ENCOUNTER — Ambulatory Visit (INDEPENDENT_AMBULATORY_CARE_PROVIDER_SITE_OTHER): Payer: Medicaid Other | Admitting: Family Medicine

## 2023-01-02 VITALS — BP 121/74 | HR 76 | Ht 64.0 in | Wt 214.0 lb

## 2023-01-02 DIAGNOSIS — Z0000 Encounter for general adult medical examination without abnormal findings: Secondary | ICD-10-CM

## 2023-01-02 DIAGNOSIS — F419 Anxiety disorder, unspecified: Secondary | ICD-10-CM | POA: Diagnosis not present

## 2023-01-02 DIAGNOSIS — E669 Obesity, unspecified: Secondary | ICD-10-CM

## 2023-01-02 DIAGNOSIS — F410 Panic disorder [episodic paroxysmal anxiety] without agoraphobia: Secondary | ICD-10-CM

## 2023-01-02 DIAGNOSIS — Z0001 Encounter for general adult medical examination with abnormal findings: Secondary | ICD-10-CM

## 2023-01-02 DIAGNOSIS — F32A Depression, unspecified: Secondary | ICD-10-CM

## 2023-01-02 DIAGNOSIS — B372 Candidiasis of skin and nail: Secondary | ICD-10-CM | POA: Insufficient documentation

## 2023-01-02 MED ORDER — SRONYX 0.1-20 MG-MCG PO TABS: 1.0000 | ORAL_TABLET | Freq: Every day | ORAL | 3 refills | Status: DC

## 2023-01-02 MED ORDER — SERTRALINE HCL 100 MG PO TABS: 150.0000 mg | ORAL_TABLET | Freq: Every day | ORAL | 1 refills | Status: DC

## 2023-01-02 MED ORDER — BUPROPION HCL ER (XL) 300 MG PO TB24: 300.0000 mg | ORAL_TABLET | Freq: Every day | ORAL | 1 refills | Status: DC

## 2023-01-02 NOTE — Progress Notes (Signed)
BP 121/74   Pulse 76   Ht 5\' 4"  (1.626 m)   Wt 214 lb (97.1 kg)   LMP 12/26/2022 (Approximate)   SpO2 100%   BMI 36.73 kg/m    Subjective:   Patient ID: Amanda Lam, female    DOB: 1990-04-09, 32 y.o.   MRN: [id number]  HPI: Amanda Lam is a 33 y.o. female presenting on 01/02/2023 for Medical Management of Chronic Issues (CPE- no pap), Anxiety, Depression, and Weight Gain  Patient is here today for annual exam and follow-up for anxiety and depression. She denies fever, chills, congestion, sinus pressure, shortness of breath, chest pain, constipation, diarrhea, dysuria, or headaches.  Anxiety and Depression Patient reports her symptoms were initially well-controlled with the Wellbutrin and Zoloft, but she is no longer responding to the medications. She is overwhelmed with everything going on in her life as she has lost both of her parents and has a 83-year-old child at home. She feels her mood is depressed more often than not, and she experiences constant stress/anxiety. She easily becomes irritable, especially when trying to discipline her child. Her appetite has also increased where she is eating when emotional or bored. She sleeps around 4 hours per night, partially because of having a young child at home and partially because she wakes up frequently throughout the night. She denies suicidal or homicidal ideation. She denies any thoughts of self-harm. She has never tried therapy/counseling because she feels the results are not beneficial long-term.  Weight gain Patient was receiving Ozempic from [facility] in [place]. She lost 20 pounds over a 29-month period, but she is no longer able to participate in the program as it requires weekly visits, which interfere with her work schedule. Since stopping Ozempic, she has gained 28 pounds. She would like a medication to help with weight loss because her weight is contributing to her anxiety and depression.  Patient also reports vaginal  itching and urinary frequency that started after a course of oral steroids for allergies. She denies dysuria, hematuria, fevers, chills, or rashes in the genital area.  Relevant past medical, surgical, family and social history reviewed and updated as indicated. Interim medical history since our last visit reviewed. Allergies and medications reviewed and updated.  Review of Systems  Constitutional:  Positive for appetite change. Negative for chills and fever.       Emotional eating  HENT:  Negative for congestion, ear pain and sinus pressure.   Eyes:  Negative for visual disturbance.  Respiratory:  Negative for chest tightness and shortness of breath.   Cardiovascular:  Negative for chest pain, palpitations and leg swelling.  Gastrointestinal:  Negative for abdominal pain, constipation, diarrhea, nausea and vomiting.  Genitourinary:  Positive for frequency and vaginal discharge. Negative for difficulty urinating, dysuria and urgency.  Musculoskeletal:  Negative for arthralgias and myalgias.  Skin:  Negative for rash.  Neurological:  Negative for light-headedness and headaches.  Psychiatric/Behavioral:  Positive for dysphoric mood and sleep disturbance. Negative for self-injury and suicidal ideas. The patient is nervous/anxious.     Per HPI unless specifically indicated above   Allergies as of 01/02/2023       Reactions   Penicillins Anaphylaxis        Medication List        Accurate as of January 02, 2023  4:30 PM. If you have any questions, ask your nurse or doctor.          acetaminophen 500 MG tablet Commonly known  as: TYLENOL Take by mouth.   bacitracin 500 UNIT/GM ointment Apply 1 Application topically 2 (two) times daily.   buPROPion 300 MG 24 hr tablet Commonly known as: WELLBUTRIN XL Take 1 tablet (300 mg total) by mouth daily.   sertraline 100 MG tablet Commonly known as: ZOLOFT Take 1.5 tablets (150 mg total) by mouth daily. What changed: how much to  take Changed by: Elige Radon Alajia Schmelzer   Sronyx 0.1-20 MG-MCG tablet Generic drug: levonorgestrel-ethinyl estradiol Take 1 tablet by mouth daily.         Objective:   BP 121/74   Pulse 76   Ht 5\' 4"  (1.626 m)   Wt 214 lb (97.1 kg)   LMP 12/26/2022 (Approximate)   SpO2 100%   BMI 36.73 kg/m   Wt Readings from Last 3 Encounters:  01/02/23 214 lb (97.1 kg)  09/02/22 192 lb 3.2 oz (87.2 kg)  07/22/22 196 lb (88.9 kg)    Physical Exam Vitals and nursing note reviewed.  Constitutional:      General: She is not in acute distress.    Appearance: Normal appearance. She is obese.  HENT:     Head: Normocephalic and atraumatic.     Right Ear: Tympanic membrane, ear canal and external ear normal.     Left Ear: Tympanic membrane, ear canal and external ear normal.     Mouth/Throat:     Mouth: Mucous membranes are moist.     Pharynx: Oropharynx is clear. No oropharyngeal exudate.  Eyes:     Conjunctiva/sclera: Conjunctivae normal.  Cardiovascular:     Rate and Rhythm: Normal rate and regular rhythm.     Heart sounds: Normal heart sounds.  Pulmonary:     Effort: Pulmonary effort is normal. No respiratory distress.     Breath sounds: Normal breath sounds.  Abdominal:     General: Abdomen is flat. There is no distension.     Palpations: Abdomen is soft.     Tenderness: There is no abdominal tenderness. There is no right CVA tenderness or left CVA tenderness.  Musculoskeletal:     Cervical back: Normal range of motion and neck supple. No tenderness.     Right lower leg: No edema.     Left lower leg: No edema.  Skin:    General: Skin is warm and dry.  Neurological:     Mental Status: She is alert and oriented to person, place, and time.  Psychiatric:        Attention and Perception: Attention and perception normal.        Mood and Affect: Mood is anxious and depressed. Affect is tearful.        Speech: Speech normal.        Behavior: Behavior normal. Behavior is cooperative.         Thought Content: Thought content normal. Thought content does not include homicidal or suicidal ideation. Thought content does not include homicidal or suicidal plan.        Judgment: Judgment normal.      Assessment & Plan:   Problem List Items Addressed This Visit       Other   Anxiety and depression   Relevant Medications   buPROPion (WELLBUTRIN XL) 300 MG 24 hr tablet   sertraline (ZOLOFT) 100 MG tablet   Other Relevant Orders   CBC with Differential/Platelet   TSH   Panic attacks   Relevant Medications   buPROPion (WELLBUTRIN XL) 300 MG 24 hr tablet   sertraline (ZOLOFT)  100 MG tablet   Other Relevant Orders   CBC with Differential/Platelet   TSH   Other Visit Diagnoses     Physical exam    -  Primary   Relevant Medications   SRONYX 0.1-20 MG-MCG tablet   Other Relevant Orders   CBC with Differential/Platelet   CMP14+EGFR   Lipid panel   TSH   Yeast dermatitis       Relevant Orders   Cervicovaginal ancillary only   Obesity (BMI 35.0-39.9 without comorbidity)       Relevant Orders   TSH       Anxiety and depression still not optimally controlled. Discussed options such as increasing dose of current medication, switching medications, or starting therapy. Patient would like to increase dose of current medication, so will increase Zoloft to 150 mg. Will continue current dose of Wellbutrin 300 mg. Can consider referral to therapist for adjunctive therapy in future if patient desires. Regarding vaginal itching, suspect patient has a yeast infection. Patient to complete self swab today. Will treat appropriately depending on results. Regarding weight loss, discussed difficulties with prescribing weight-loss medications due to insurance coverage. Suggested patient try a weight management program, but this is not feasible with her work schedule.  Follow up plan: Return if symptoms worsen or fail to improve, for 2 month depression follow up.  Counseling provided for  all of the vaccine components Orders Placed This Encounter  Procedures   CBC with Differential/Platelet   CMP14+EGFR   Lipid panel   TSH   Gillermina Phy, Medical Student [facility] 01/02/2023, 4:30 PM  Patient seen and examined with Gillermina Phy, medical student.  Agree with assessment and plan above.  Will increase her Zoloft to see if it helps more with anxiety and depression. Patient wants to do self vaginal swab and so she will and will treat according to those results. Patient is going to look up other options online and see if there is another virtual option for weight loss management which she feels like would help with her anxiety and depression as well. Arville Care, MD [facility] 01/09/2023, 1:15 PM

## 2023-01-03 ENCOUNTER — Telehealth: Payer: Self-pay | Admitting: Family Medicine

## 2023-01-03 LAB — TSH: TSH: 1.89 u[IU]/mL (ref 0.450–4.500)

## 2023-01-03 LAB — CBC WITH DIFFERENTIAL/PLATELET
Basophils Absolute: 0.1 10*3/uL (ref 0.0–0.2)
Basos: 1 %
EOS (ABSOLUTE): 0.2 10*3/uL (ref 0.0–0.4)
Eos: 3 %
Hematocrit: 40.3 % (ref 34.0–46.6)
Hemoglobin: 12.8 g/dL (ref 11.1–15.9)
Immature Grans (Abs): 0 10*3/uL (ref 0.0–0.1)
Immature Granulocytes: 0 %
Lymphocytes Absolute: 2 10*3/uL (ref 0.7–3.1)
Lymphs: 30 %
MCH: 26.3 pg — ABNORMAL LOW (ref 26.6–33.0)
MCHC: 31.8 g/dL (ref 31.5–35.7)
MCV: 83 fL (ref 79–97)
Monocytes Absolute: 0.4 10*3/uL (ref 0.1–0.9)
Monocytes: 6 %
Neutrophils Absolute: 4 10*3/uL (ref 1.4–7.0)
Neutrophils: 60 %
Platelets: 378 10*3/uL (ref 150–450)
RBC: 4.87 x10E6/uL (ref 3.77–5.28)
RDW: 12.8 % (ref 11.7–15.4)
WBC: 6.7 10*3/uL (ref 3.4–10.8)

## 2023-01-03 LAB — CMP14+EGFR
ALT: 10 [IU]/L (ref 0–32)
AST: 14 [IU]/L (ref 0–40)
Albumin: 4.3 g/dL (ref 3.9–4.9)
Alkaline Phosphatase: 94 [IU]/L (ref 44–121)
BUN/Creatinine Ratio: 14 (ref 9–23)
BUN: 10 mg/dL (ref 6–20)
Bilirubin Total: <0.2 mg/dL (ref 0.0–1.2)
CO2: 22 mmol/L (ref 20–29)
Calcium: 9.2 mg/dL (ref 8.7–10.2)
Chloride: 104 mmol/L (ref 96–106)
Creatinine, Ser: 0.69 mg/dL (ref 0.57–1.00)
Globulin, Total: 2.7 g/dL (ref 1.5–4.5)
Glucose: 82 mg/dL (ref 70–99)
Potassium: 4.9 mmol/L (ref 3.5–5.2)
Sodium: 140 mmol/L (ref 134–144)
Total Protein: 7 g/dL (ref 6.0–8.5)
eGFR: 118 mL/min/{1.73_m2} (ref >59)

## 2023-01-03 LAB — LIPID PANEL
Chol/HDL Ratio: 4.5 ratio — ABNORMAL HIGH (ref 0.0–4.4)
Cholesterol, Total: 224 mg/dL — ABNORMAL HIGH (ref 100–199)
HDL: 50 mg/dL (ref >39)
LDL Chol Calc (NIH): 147 mg/dL — ABNORMAL HIGH (ref 0–99)
Triglycerides: 152 mg/dL — ABNORMAL HIGH (ref 0–149)
VLDL Cholesterol Cal: 27 mg/dL (ref 5–40)

## 2023-01-03 NOTE — Telephone Encounter (Signed)
Copied from CRM [id number]. Topic: Clinical - Medical Advice >> Jan 03, 2023 10:11 AM Tiffany H wrote: Reason for CRM: Patient called to advise that she's been researching weight loss programs since their last conversation. Patient would like to verify that [facility] (a private service for weight loss injections and other programs) would be appropriate for her. Please assist.  [email or web address] - patient has already spoken with a representative for the company.

## 2023-01-04 LAB — CERVICOVAGINAL ANCILLARY ONLY
Bacterial Vaginitis (gardnerella): NEGATIVE
Candida Glabrata: NEGATIVE
Candida Vaginitis: NEGATIVE
Comment: NEGATIVE
Comment: NEGATIVE
Comment: NEGATIVE
Comment: NEGATIVE
Trichomonas: NEGATIVE

## 2023-01-04 NOTE — Telephone Encounter (Signed)
Pt made aware that the program is fine to try per Dettinger.  Discussed lab results but they have not been reviewed at this time. Informed pt that she would get a call once reviewed by Dettinger.

## 2023-01-04 NOTE — Telephone Encounter (Signed)
Let her know that from what I can tell online it looks more like a legit company and looks like it has been around for at least over a year and looks like it has some decent reviews from patients that have actually used it.

## 2023-02-23 ENCOUNTER — Ambulatory Visit: Payer: Medicaid Other | Admitting: Nurse Practitioner

## 2023-02-27 ENCOUNTER — Ambulatory Visit: Payer: Medicaid Other | Admitting: Nurse Practitioner

## 2023-02-28 ENCOUNTER — Telehealth: Payer: Self-pay | Admitting: Family Medicine

## 2023-02-28 ENCOUNTER — Other Ambulatory Visit (HOSPITAL_COMMUNITY): Payer: Self-pay

## 2023-02-28 NOTE — Telephone Encounter (Signed)
 Copied from CRM [id number]. Topic: General - Other >> Feb 28, 2023  2:55 PM Elle L wrote: Reason for CRM: The patient was requesting a prior approval for [facility] . Fax: [phone] Phone: [phone] [email or web address] The patient's call back number is [phone] if needed.

## 2023-02-28 NOTE — Telephone Encounter (Signed)
 Amanda Lam is not on patient's medication list. Please advise if provider wants to prescribe [name] before we submit PA.

## 2023-03-01 ENCOUNTER — Telehealth: Payer: Self-pay | Admitting: Family Medicine

## 2023-03-01 NOTE — Telephone Encounter (Signed)
 Pt states that her insurance will cover wt loss meds after a PA approval.  Pt has an appt with Dettinger 1/15. She will wait until then and discuss [facility] .  She does not want to do Ozempic  because it became too expensive and she had to drive out of her way to pick it up as well.  Informed pt that insurance may not cover med even with PA.

## 2023-03-01 NOTE — Telephone Encounter (Signed)
 Left message for pt to return call.  At visit in November [facility] was not discussed. Pt was to consider weight loss program. [facility] has been tried and worked but Community education officer will not approve.  Does pt want to try [facility]? Try PA for Ozempic?

## 2023-03-01 NOTE — Telephone Encounter (Signed)
 Copied from CRM [id number]. Topic: Clinical - Lab/Test Results >> Mar 01, 2023  9:11 AM Miller H wrote: Reason for CRM: Patient received a call from Nurse Kawana Hegel//Please call back >> Mar 01, 2023  9:33 AM Nurse Rosina DEL wrote: See previous telephone encounter

## 2023-03-08 ENCOUNTER — Ambulatory Visit: Payer: Medicaid Other | Admitting: Family Medicine

## 2023-03-08 ENCOUNTER — Encounter: Payer: Self-pay | Admitting: Family Medicine

## 2023-03-08 VITALS — BP 124/73 | HR 93 | Ht 64.0 in | Wt 215.0 lb

## 2023-03-08 DIAGNOSIS — F32A Depression, unspecified: Secondary | ICD-10-CM

## 2023-03-08 DIAGNOSIS — F419 Anxiety disorder, unspecified: Secondary | ICD-10-CM

## 2023-03-08 MED ORDER — SEMAGLUTIDE-WEIGHT MANAGEMENT 0.25 MG/0.5ML ~~LOC~~ SOAJ: 0.2500 mg | SUBCUTANEOUS | 0 refills | Status: AC

## 2023-03-08 MED ORDER — SEMAGLUTIDE-WEIGHT MANAGEMENT 2.4 MG/0.75ML ~~LOC~~ SOAJ: 2.4000 mg | SUBCUTANEOUS | 0 refills | Status: DC

## 2023-03-08 MED ORDER — SEMAGLUTIDE-WEIGHT MANAGEMENT 1 MG/0.5ML ~~LOC~~ SOAJ: 1.0000 mg | SUBCUTANEOUS | 0 refills | Status: AC

## 2023-03-08 MED ORDER — DESVENLAFAXINE SUCCINATE ER 50 MG PO TB24: 50.0000 mg | ORAL_TABLET | Freq: Every day | ORAL | 1 refills | Status: DC

## 2023-03-08 MED ORDER — SEMAGLUTIDE-WEIGHT MANAGEMENT 1.7 MG/0.75ML ~~LOC~~ SOAJ: 1.7000 mg | SUBCUTANEOUS | 0 refills | Status: AC

## 2023-03-08 MED ORDER — SEMAGLUTIDE-WEIGHT MANAGEMENT 0.5 MG/0.5ML ~~LOC~~ SOAJ: 0.5000 mg | SUBCUTANEOUS | 0 refills | Status: AC

## 2023-03-08 NOTE — Progress Notes (Addendum)
 BP 124/73   Pulse 93   Ht 5\' 4"  (1.626 m)   Wt 215 lb (97.5 kg)   SpO2 98%   BMI 36.90 kg/m    Subjective:   Patient ID: Amanda Lam, female    DOB: [birth year], 33 y.o.   MRN: [id number]  HPI: Amanda Lam is a 33 y.o. female presenting on 03/08/2023 for Medical Management of Chronic Issues and Depression   HPI Anxiety depression recheck Patient is coming in for anxiety and depression recheck.  She currently takes Zoloft  and Wellbutrin .  She feels like she did not really getting benefit from going up on the Zoloft  to 150 mg. She denies any suicidal ideations or thoughts of hurting self.  She just feels like she gets a lot of worrying emotions and frustrations and anger that she does not necessarily have a reason for but just gets a lot of them.    03/08/2023    3:52 PM 03/08/2023    3:51 PM 01/02/2023    2:39 PM 09/02/2022    8:27 AM 07/22/2022    2:41 PM  Depression screen PHQ 2/9  Decreased Interest 1 1 2  0 0  Down, Depressed, Hopeless 1 1 2  0 0  PHQ - 2 Score 2 2 4  0 0  Altered sleeping 0 0 1 0 0  Tired, decreased energy 3 3 3 1 1   Change in appetite 3 3 2  0 0  Feeling bad or failure about yourself  1 1 1  0 0  Trouble concentrating 0 0 0 0 0  Moving slowly or fidgety/restless 0 0 0 0 0  Suicidal thoughts 0 0 0 0 0  PHQ-9 Score 9 9 11 1 1   Difficult doing work/chores Somewhat difficult Somewhat difficult Somewhat difficult Not difficult at all Not difficult at all    Obesity and weight management Patient is coming in to discuss obesity and weight management.  She says she still been struggling with her weight and she has increased again weight of 23 pounds in the past 6 months and she feels like she has tried diets and exercise.  She says she did go through an alternative company and had semaglutide  in the past and it did help her significantly but then that should she stopped it she gained all the weight back and just has not been feeling well.  She would like to try to  see if she can get semaglutide  again.  She is currently semiactive but working to try and be more active.  She is also tried dietitians in the past and has had a prescription for phentermine in the past which she did not do as well on.  Relevant past medical, surgical, family and social history reviewed and updated as indicated. Interim medical history since our last visit reviewed. Allergies and medications reviewed and updated.  Review of Systems  Constitutional:  Negative for chills and fever.  HENT:  Negative for congestion, ear discharge and ear pain.   Eyes:  Negative for redness and visual disturbance.  Respiratory:  Negative for chest tightness and shortness of breath.   Cardiovascular:  Negative for chest pain and leg swelling.  Genitourinary:  Negative for difficulty urinating and dysuria.  Musculoskeletal:  Negative for back pain and gait problem.  Skin:  Negative for rash.  Neurological:  Negative for dizziness, light-headedness and headaches.  Psychiatric/Behavioral:  Positive for dysphoric mood and sleep disturbance. Negative for agitation, behavioral problems, self-injury and suicidal ideas. The patient is  nervous/anxious.   All other systems reviewed and are negative.   Per HPI unless specifically indicated above   Allergies as of 03/08/2023       Reactions   Penicillins Anaphylaxis        Medication List        Accurate as of March 08, 2023  4:33 PM. If you have any questions, ask your nurse or doctor.          STOP taking these medications    sertraline  100 MG tablet Commonly known as: ZOLOFT  Stopped by: Lucio Sabin Shavar Gorka       TAKE these medications    acetaminophen 500 MG tablet Commonly known as: TYLENOL Take by mouth.   bacitracin  500 UNIT/GM ointment Apply 1 Application topically 2 (two) times daily.   buPROPion  300 MG 24 hr tablet Commonly known as: WELLBUTRIN  XL Take 1 tablet (300 mg total) by mouth daily.   desvenlafaxine  50 MG  24 hr tablet Commonly known as: Pristiq  Take 1 tablet (50 mg total) by mouth daily. Started by: Lucio Sabin Dannel Rafter   Sronyx  0.1-20 MG-MCG tablet Generic drug: levonorgestrel-ethinyl estradiol Take 1 tablet by mouth daily.         Objective:   BP 124/73   Pulse 93   Ht 5\' 4"  (1.626 m)   Wt 215 lb (97.5 kg)   SpO2 98%   BMI 36.90 kg/m   Wt Readings from Last 3 Encounters:  03/08/23 215 lb (97.5 kg)  01/02/23 214 lb (97.1 kg)  09/02/22 192 lb 3.2 oz (87.2 kg)    Physical Exam Vitals and nursing note reviewed.  Constitutional:      General: She is not in acute distress.    Appearance: She is well-developed. She is not diaphoretic.  Eyes:     Conjunctiva/sclera: Conjunctivae normal.  Cardiovascular:     Rate and Rhythm: Normal rate and regular rhythm.     Heart sounds: Normal heart sounds. No murmur heard. Pulmonary:     Effort: Pulmonary effort is normal. No respiratory distress.     Breath sounds: Normal breath sounds. No wheezing.  Musculoskeletal:        General: No tenderness. Normal range of motion.  Skin:    General: Skin is warm and dry.     Findings: No rash.  Neurological:     Mental Status: She is alert and oriented to person, place, and time.     Coordination: Coordination normal.  Psychiatric:        Mood and Affect: Mood is anxious and depressed.        Behavior: Behavior normal.        Thought Content: Thought content does not include suicidal ideation. Thought content does not include suicidal plan.       Assessment & Plan:   Problem List Items Addressed This Visit       Other   Anxiety and depression - Primary   Relevant Medications   desvenlafaxine  (PRISTIQ ) 50 MG 24 hr tablet    Patient's BMI is >30 mg/m2.  Patient's current BMI is Body mass index is 36.9 kg/m.Aaron Aas  Patient is currently enrolled in a healthy eating plan along with encouraged exercise.  Patient has contraindications to phentermine, Contrave & Qsymia (contains  phentermine), because she has tried the phentermine and is currently on Wellbutrin .  Patient does not have a personal or family history of medullary thyroid  carcinoma (MTC) or Multiple Endocrine Neoplasia syndrome type 2 (MEN 2).   For anxiety  and depression we will keep the Wellbutrin . Transition down off of the Zoloft  by taking 100 mg for 4 days and then 50 mg for the next 4 days and then come off the Zoloft  and start the Pristiq  the day that she comes off the Zoloft   Follow up plan: Return if symptoms worsen or fail to improve, for 5-week anxiety depression.  Counseling provided for all of the vaccine components No orders of the defined types were placed in this encounter.   Jolyne Needs, MD [facility] 03/08/2023, 4:33 PM

## 2023-03-08 NOTE — Addendum Note (Signed)
 Addended by: Jolyne Needs on: 03/08/2023 04:39 PM   Modules accepted: Orders

## 2023-03-08 NOTE — Patient Instructions (Signed)
 For anxiety and depression we will keep the Wellbutrin . Transition down off of the Zoloft  by taking 100 mg for 4 days and then 50 mg for the next 4 days and then come off the Zoloft  and start the Pristiq  the day that she comes off the Zoloft 

## 2023-03-09 ENCOUNTER — Other Ambulatory Visit (HOSPITAL_COMMUNITY): Payer: Self-pay

## 2023-03-09 ENCOUNTER — Telehealth: Payer: Self-pay

## 2023-03-09 ENCOUNTER — Telehealth: Payer: Self-pay | Admitting: Family Medicine

## 2023-03-09 NOTE — Telephone Encounter (Signed)
PA request has been Submitted. New Encounter created for follow up. For additional info see Pharmacy Prior Auth telephone encounter from 03/09/23.

## 2023-03-09 NOTE — Telephone Encounter (Signed)
Pharmacy Patient Advocate Encounter   Received notification from Pt Calls Messages that prior authorization for Wegovy 0.25MG /0.5ML auto-injectors is required/requested.   Insurance verification completed.   The patient is insured through [facility] .   Per test claim: PA required; PA submitted to above mentioned insurance via CoverMyMeds Key/confirmation #/EOC  [employer] Status is pending

## 2023-03-09 NOTE — Telephone Encounter (Signed)
Copied from CRM [id number]. Topic: Clinical - Medication Question >> Mar 09, 2023  9:50 AM Donita Brooks wrote: Reason for CRM: pt is stating that her insurance company send over fax PA for weight loss medication wegovy, pt wants to know if the office receive the fax

## 2023-03-10 ENCOUNTER — Other Ambulatory Visit (HOSPITAL_COMMUNITY): Payer: Self-pay

## 2023-03-10 NOTE — Telephone Encounter (Signed)
Left message making pt aware and to call pharmacy to fill.

## 2023-03-10 NOTE — Telephone Encounter (Signed)
Pharmacy Patient Advocate Encounter  Received notification from [facility] that Prior Authorization for [facility] 0.25MG /0.5ML auto-injectors  has been APPROVED from 03/09/23 to 09/06/23. Ran test claim, Copay is $4. This test claim was processed through [facility]- copay amounts may vary at other pharmacies due to pharmacy/plan contracts, or as the patient moves through the different stages of their insurance plan.   PA #/Case ID/Reference #: [id number]

## 2023-03-13 ENCOUNTER — Ambulatory Visit: Payer: Self-pay | Admitting: Family Medicine

## 2023-03-13 NOTE — Telephone Encounter (Signed)
Copied from CRM [id number]. Topic: Clinical - Red Word Triage >> Mar 13, 2023  5:26 PM Elle L wrote: Red Word that prompted transfer to Nurse Triage: The patient took her Semaglutide today and has been vomiting, nauseas, unable to eat, with a severe headache. Reason for Disposition  General information question, no triage required and triager able to answer question  Answer Assessment - Initial Assessment Questions 1. REASON FOR CALL or QUESTION: "What is your reason for calling today?" or "How can I best help you?" or "What question do you have that I can help answer?"     Patient called in asking for suggestions on side effects for Semaglutide. Patient states she took the shot this morning around 10 am - lowest dose - and has vomited once, and nausous. Advised patient on small bites and intake of fluids. Advised patient this is normal side effects of the medication and to try ginger nausea tablets. Advised patient to keep hydration level adequate as well. Advised patient to call back if symptoms do not improve over the next few days.  Protocols used: Information Only Call - No Triage-A-AH

## 2023-03-14 NOTE — Telephone Encounter (Signed)
Pt is feeling better today. Some nausea and headache but able to tolerate crackers and fluids.  She normally has coffee but has not craved any like she normally does. Informed to have a little caffeine that it may be the reason her head hurts.  Pt said what she is taking OTC for nausea works but it taste "nasty". Could something else be called in? Informed that Dettinger is out today and will address tomorrow. Pt understood.

## 2023-03-15 MED ORDER — ONDANSETRON 4 MG PO TBDP: 4.0000 mg | ORAL_TABLET | Freq: Three times a day (TID) | ORAL | 0 refills | Status: DC | PRN

## 2023-03-15 NOTE — Telephone Encounter (Signed)
Pt made aware and states that she feels better today.

## 2023-03-15 NOTE — Telephone Encounter (Signed)
Sent some Zofran for her but it also may help her to keep something small on hand like nuts to help calm her nausea.  Do not eat too many of them but just have a few every hour to and that should help keep things calm down

## 2023-03-15 NOTE — Addendum Note (Signed)
Addended by: Arville Care on: 03/15/2023 07:54 AM   Modules accepted: Orders

## 2023-03-30 ENCOUNTER — Other Ambulatory Visit: Payer: Self-pay | Admitting: Family Medicine

## 2023-03-30 DIAGNOSIS — F419 Anxiety disorder, unspecified: Secondary | ICD-10-CM

## 2023-04-12 ENCOUNTER — Ambulatory Visit: Payer: Medicaid Other | Admitting: Family Medicine

## 2023-05-03 ENCOUNTER — Ambulatory Visit: Payer: Self-pay | Admitting: Family Medicine

## 2023-05-03 ENCOUNTER — Ambulatory Visit: Payer: Medicaid Other | Admitting: Family Medicine

## 2023-05-03 NOTE — Telephone Encounter (Signed)
 Pt had to cancel appt today due to HA, nausea, sweating and having chills. She just took Tylenol and wanted to know if Zofran can be taken with it. Advised pt that she could. Instructed to lay down, rest. Drink water, saltine crackers and peppermint can also help. Advised pt to call for a sooner appt or go to the ER if no better.

## 2023-05-03 NOTE — Telephone Encounter (Signed)
  Chief Complaint: nausea Symptoms: nausea, bodyaches, and headache Frequency: started about an hour ago Pertinent Negatives: Patient denies sob, chest pain Disposition: [] ED /[] Urgent Care (no appt availability in office) / [] Appointment(In office/virtual)/ []  [facility]/ [x] Home Care/ [] Refused Recommended Disposition /[] [place] [facility]/ []  Follow-up with PCP Additional Notes: patient states that she is on 0.5 mg of Wegovy and took her dose last night and about an hour ago she started feeling nauseous and got a headache and chills. Patient states that she took some tylenol and was questioning about taking the zofran that was also prescribed to her with her [facility].    Copied from CRM [id number]. Topic: Clinical - Pink Word Triage >> May 03, 2023  1:41 PM DeAngela L wrote: Reason for Triage: Patient states she is having nausea and hot and cold sweats and wanted to know if the meds she has available could be taken for some relief Reason for Disposition  Unexplained nausea  [1] Caller has NON-URGENT medicine question about med that PCP prescribed AND [2] triager unable to answer question  Answer Assessment - Initial Assessment Questions 1. NAUSEA SEVERITY: "How bad is the nausea?" (e.g., mild, moderate, severe; dehydration, weight loss)   - MILD: loss of appetite without change in eating habits   - MODERATE: decreased oral intake without significant weight loss, dehydration, or malnutrition   - SEVERE: inadequate caloric or fluid intake, significant weight loss, symptoms of dehydration     mild 2. ONSET: "When did the nausea begin?"     today 3. VOMITING: "Any vomiting?" If Yes, ask: "How many times today?"     no 4. RECURRENT SYMPTOM: "Have you had nausea before?" If Yes, ask: "When was the last time?" "What happened that time?"     Yes when she started wegovy 5. CAUSE: "What do you think is causing the nausea?"     Unknown- could be her wegovy  Answer Assessment -  Initial Assessment Questions 1. NAME of MEDICINE: "What medicine(s) are you calling about?"     Wegovy  2. QUESTION: "What is your question?" (e.g., double dose of medicine, side effect)     0.5mg  3. PRESCRIBER: "Who prescribed the medicine?" Reason: if prescribed by specialist, call should be referred to that group.     Dr. Louanne Skye 4. SYMPTOMS: "Do you have any symptoms?" If Yes, ask: "What symptoms are you having?"  "How bad are the symptoms (e.g., mild, moderate, severe)     Mild nausea and sweating and headache.  Protocols used: Nausea-A-AH, Medication Question Call-A-AH

## 2023-05-05 ENCOUNTER — Encounter: Payer: Self-pay | Admitting: Family Medicine

## 2023-05-22 ENCOUNTER — Encounter: Payer: Self-pay | Admitting: Family Medicine

## 2023-05-22 ENCOUNTER — Ambulatory Visit: Admitting: Family Medicine

## 2023-05-22 VITALS — BP 118/87 | HR 96 | Ht 64.0 in | Wt 208.0 lb

## 2023-05-22 DIAGNOSIS — E669 Obesity, unspecified: Secondary | ICD-10-CM

## 2023-05-22 DIAGNOSIS — F419 Anxiety disorder, unspecified: Secondary | ICD-10-CM | POA: Diagnosis not present

## 2023-05-22 DIAGNOSIS — F410 Panic disorder [episodic paroxysmal anxiety] without agoraphobia: Secondary | ICD-10-CM

## 2023-05-22 DIAGNOSIS — F32A Depression, unspecified: Secondary | ICD-10-CM

## 2023-05-22 MED ORDER — BUPROPION HCL ER (XL) 300 MG PO TB24: 300.0000 mg | ORAL_TABLET | Freq: Every day | ORAL | 1 refills | Status: DC

## 2023-05-22 MED ORDER — DESVENLAFAXINE SUCCINATE ER 50 MG PO TB24: 50.0000 mg | ORAL_TABLET | Freq: Every day | ORAL | 1 refills | Status: DC

## 2023-05-22 NOTE — Progress Notes (Signed)
 BP 118/87   Pulse 96   Ht 5\' 4"  (1.626 m)   Wt 208 lb (94.3 kg)   SpO2 98%   BMI 35.70 kg/m    Subjective:   Patient ID: Amanda Lam, female    DOB: 1990/05/26, 33 y.o.   MRN: [id number]  HPI: Amanda Lam is a 33 y.o. female presenting on 05/22/2023 for Medical Management of Chronic Issues, Depression, Anxiety, and Weight Check   HPI Anxiety depression recheck Patient is coming in today for anxiety depression recheck.  She is currently taking Pristiq and Wellbutrin.  Patient feels like her anxiety depression is about the same.  She says she not feeling overly depressed or anxious but she still gets irritable easily and has some anxiety.  She also says since starting the Pristiq she does feel little more sleepy and has less energy through the daytime.    03/08/2023    3:52 PM 03/08/2023    3:51 PM 01/02/2023    2:39 PM 09/02/2022    8:27 AM 07/22/2022    2:41 PM  Depression screen PHQ 2/9  Decreased Interest 1 1 2  0 0  Down, Depressed, Hopeless 1 1 2  0 0  PHQ - 2 Score 2 2 4  0 0  Altered sleeping 0 0 1 0 0  Tired, decreased energy 3 3 3 1 1   Change in appetite 3 3 2  0 0  Feeling bad or failure about yourself  1 1 1  0 0  Trouble concentrating 0 0 0 0 0  Moving slowly or fidgety/restless 0 0 0 0 0  Suicidal thoughts 0 0 0 0 0  PHQ-9 Score 9 9 11 1 1   Difficult doing work/chores Somewhat difficult Somewhat difficult Somewhat difficult Not difficult at all Not difficult at all    Obesity and weight loss recheck Patient is coming in today for obesity and weight loss recheck.  She has been taking the [facility] and she is currently up to the 1 mg dose.  She is down 7 pounds from just over a month ago.  She is having some indigestion and sulfur burps with the [facility].  She is currently on 1 mg.  She has some abdominal complaints including indigestion that had some diarrhea but the diarrhea is improved in the abdominal complaints are improving.  Relevant past medical, surgical, family  and social history reviewed and updated as indicated. Interim medical history since our last visit reviewed. Allergies and medications reviewed and updated.  Review of Systems  Constitutional:  Negative for chills and fever.  HENT:  Negative for congestion, ear discharge and ear pain.   Eyes:  Negative for visual disturbance.  Respiratory:  Negative for chest tightness and shortness of breath.   Cardiovascular:  Negative for chest pain and leg swelling.  Gastrointestinal:  Positive for diarrhea and nausea.  Genitourinary:  Negative for difficulty urinating and dysuria.  Musculoskeletal:  Negative for back pain and gait problem.  Skin:  Negative for rash.  Neurological:  Negative for dizziness, light-headedness and headaches.  Psychiatric/Behavioral:  Positive for dysphoric mood. Negative for agitation, behavioral problems, self-injury, sleep disturbance and suicidal ideas. The patient is nervous/anxious.   All other systems reviewed and are negative.   Per HPI unless specifically indicated above   Allergies as of 05/22/2023       Reactions   Penicillins Anaphylaxis        Medication List        Accurate as of May 22, 2023  2:36 PM. If you have any questions, ask your nurse or doctor.          acetaminophen 500 MG tablet Commonly known as: TYLENOL Take by mouth.   bacitracin 500 UNIT/GM ointment Apply 1 Application topically 2 (two) times daily.   buPROPion 300 MG 24 hr tablet Commonly known as: WELLBUTRIN XL Take 1 tablet (300 mg total) by mouth daily.   desvenlafaxine 50 MG 24 hr tablet Commonly known as: PRISTIQ Take 1 tablet (50 mg total) by mouth daily.   ondansetron 4 MG disintegrating tablet Commonly known as: ZOFRAN-ODT Take 1 tablet (4 mg total) by mouth every 8 (eight) hours as needed for nausea or vomiting.   Semaglutide-Weight Management 1 MG/0.5ML Soaj Inject 1 mg into the skin once a week for 28 days.   Semaglutide-Weight Management 1.7  MG/0.75ML Soaj Inject 1.7 mg into the skin once a week for 28 days. Start taking on: June 03, 2023   Semaglutide-Weight Management 2.4 MG/0.75ML Soaj Inject 2.4 mg into the skin once a week for 28 days. Start taking on: Jul 02, 2023   Sronyx 0.1-20 MG-MCG tablet Generic drug: levonorgestrel-ethinyl estradiol Take 1 tablet by mouth daily.         Objective:   BP 118/87   Pulse 96   Ht 5\' 4"  (1.626 m)   Wt 208 lb (94.3 kg)   SpO2 98%   BMI 35.70 kg/m   Wt Readings from Last 3 Encounters:  05/22/23 208 lb (94.3 kg)  03/08/23 215 lb (97.5 kg)  01/02/23 214 lb (97.1 kg)    Physical Exam Vitals and nursing note reviewed.  Constitutional:      General: She is not in acute distress.    Appearance: She is well-developed. She is not diaphoretic.  Eyes:     Conjunctiva/sclera: Conjunctivae normal.  Cardiovascular:     Rate and Rhythm: Normal rate and regular rhythm.     Heart sounds: Normal heart sounds. No murmur heard. Pulmonary:     Effort: Pulmonary effort is normal. No respiratory distress.     Breath sounds: Normal breath sounds. No wheezing.  Musculoskeletal:        General: No swelling.  Skin:    General: Skin is warm and dry.     Findings: No rash.  Neurological:     Mental Status: She is alert and oriented to person, place, and time.     Coordination: Coordination normal.  Psychiatric:        Mood and Affect: Mood is anxious and depressed.        Behavior: Behavior normal.        Thought Content: Thought content does not include suicidal ideation. Thought content does not include suicidal plan.       Assessment & Plan:   Problem List Items Addressed This Visit       Other   Anxiety and depression - Primary   Relevant Medications   desvenlafaxine (PRISTIQ) 50 MG 24 hr tablet   buPROPion (WELLBUTRIN XL) 300 MG 24 hr tablet   Other Relevant Orders   CBC with Differential/Platelet   TSH   Panic attacks   Relevant Medications   desvenlafaxine  (PRISTIQ) 50 MG 24 hr tablet   buPROPion (WELLBUTRIN XL) 300 MG 24 hr tablet   Other Relevant Orders   CBC with Differential/Platelet   TSH   Other Visit Diagnoses       Obesity (BMI 35.0-39.9 without comorbidity)  She did not want to go up on the dose of the Pristiq yet, did instruct her to switch it to nighttime to see if she does little bit better with her somnolence.  Will stay at the [facility] dose of 1 mg for now and recommend she use some Pepcid to go her abdominal complaints calm down. Follow up plan: Return if symptoms worsen or fail to improve, for 1 to 2 months anxiety and depression recheck.  Counseling provided for all of the vaccine components Orders Placed This Encounter  Procedures   CBC with Differential/Platelet   TSH    Arville Care, MD [facility] 05/22/2023, 2:36 PM

## 2023-05-23 LAB — CBC WITH DIFFERENTIAL/PLATELET
Basophils Absolute: 0.1 10*3/uL (ref 0.0–0.2)
Basos: 1 %
EOS (ABSOLUTE): 0.1 10*3/uL (ref 0.0–0.4)
Eos: 2 %
Hematocrit: 39.6 % (ref 34.0–46.6)
Hemoglobin: 13.1 g/dL (ref 11.1–15.9)
Immature Grans (Abs): 0 10*3/uL (ref 0.0–0.1)
Immature Granulocytes: 0 %
Lymphocytes Absolute: 2.2 10*3/uL (ref 0.7–3.1)
Lymphs: 35 %
MCH: 26.8 pg (ref 26.6–33.0)
MCHC: 33.1 g/dL (ref 31.5–35.7)
MCV: 81 fL (ref 79–97)
Monocytes Absolute: 0.5 10*3/uL (ref 0.1–0.9)
Monocytes: 8 %
Neutrophils Absolute: 3.3 10*3/uL (ref 1.4–7.0)
Neutrophils: 54 %
Platelets: 349 10*3/uL (ref 150–450)
RBC: 4.88 x10E6/uL (ref 3.77–5.28)
RDW: 13.5 % (ref 11.7–15.4)
WBC: 6.2 10*3/uL (ref 3.4–10.8)

## 2023-05-23 LAB — TSH: TSH: 1.18 u[IU]/mL (ref 0.450–4.500)

## 2023-05-29 ENCOUNTER — Ambulatory Visit (INDEPENDENT_AMBULATORY_CARE_PROVIDER_SITE_OTHER)

## 2023-05-29 ENCOUNTER — Encounter: Payer: Self-pay | Admitting: Family

## 2023-05-29 ENCOUNTER — Ambulatory Visit: Payer: Self-pay

## 2023-05-29 ENCOUNTER — Ambulatory Visit: Admitting: Family

## 2023-05-29 VITALS — BP 122/79 | HR 89 | Temp 98.0°F | Ht 64.0 in | Wt 209.8 lb

## 2023-05-29 DIAGNOSIS — R1084 Generalized abdominal pain: Secondary | ICD-10-CM

## 2023-05-29 LAB — MICROSCOPIC EXAMINATION
Bacteria, UA: NONE SEEN
RBC, Urine: NONE SEEN /HPF (ref 0–2)
Renal Epithel, UA: NONE SEEN /HPF
Yeast, UA: NONE SEEN

## 2023-05-29 LAB — URINALYSIS, COMPLETE
Bilirubin, UA: NEGATIVE
Glucose, UA: NEGATIVE
Ketones, UA: NEGATIVE
Leukocytes,UA: NEGATIVE
Nitrite, UA: NEGATIVE
Protein,UA: NEGATIVE
RBC, UA: NEGATIVE
Specific Gravity, UA: 1.015 (ref 1.005–1.030)
Urobilinogen, Ur: 0.2 mg/dL (ref 0.2–1.0)
pH, UA: 5.5 (ref 5.0–7.5)

## 2023-05-29 NOTE — Progress Notes (Signed)
 Subjective:    Patient ID: Amanda Lam, female    DOB: October 27, 1990, 33 y.o.   MRN: [id number]  Chief Complaint  Patient presents with   Abdominal Pain    Started hurting last night and pain in left side radiating towards back patient thinks it is related to her shots    Pt presents to the office today with generalized abdominal pain that started yesterday. Reports she has had upper abdominal pain on and off since taking Wegovy. However, last night started having right periumbilical pain that came on suddenly. She continues to have the upper abdominal pain.  Abdominal Pain This is a new problem. The current episode started yesterday. The onset quality is sudden. The problem has been unchanged. The pain is located in the periumbilical region, LUQ and RUQ. The pain is at a severity of 7/10. The pain is moderate. The quality of the pain is dull and aching. Associated symptoms include belching and flatus. Pertinent negatives include no constipation, diarrhea, dysuria, fever, frequency, hematuria, nausea, vomiting or weight loss. The pain is aggravated by certain positions. She has tried nothing for the symptoms. The treatment provided no relief.      Review of Systems  Constitutional:  Negative for fever and weight loss.  Gastrointestinal:  Positive for abdominal pain and flatus. Negative for constipation, diarrhea, nausea and vomiting.  Genitourinary:  Negative for dysuria, frequency and hematuria.  All other systems reviewed and are negative.   Social History   Socioeconomic History   Marital status: Single    Spouse name: Not on file   Number of children: Not on file   Years of education: Not on file   Highest education level: 12th grade  Occupational History   Not on file  Tobacco Use   Smoking status: Former    Current packs/day: 0.50    Average packs/day: 0.5 packs/day for 7.0 years (3.5 ttl pk-yrs)    Types: Cigarettes   Smokeless tobacco: Never  Vaping Use   Vaping  status: Never Used  Substance and Sexual Activity   Alcohol use: No   Drug use: No   Sexual activity: Not Currently  Other Topics Concern   Not on file  Social History Narrative   Not on file   Social Drivers of Health   Financial Resource Strain: Medium Risk (02/21/2023)   Overall Financial Resource Strain (CARDIA)    Difficulty of Paying Living Expenses: Somewhat hard  Food Insecurity: No Food Insecurity (02/21/2023)   Hunger Vital Sign    Worried About Running Out of Food in the Last Year: Never true    Ran Out of Food in the Last Year: Never true  Transportation Needs: No Transportation Needs (02/21/2023)   PRAPARE - Administrator, Civil Service (Medical): No    Lack of Transportation (Non-Medical): No  Physical Activity: Insufficiently Active (02/21/2023)   Exercise Vital Sign    Days of Exercise per Week: 3 days    Minutes of Exercise per Session: 30 min  Stress: No Stress Concern Present (02/21/2023)   [employer] of Occupational Health - Occupational Stress Questionnaire    Feeling of Stress : Not at all  Social Connections: Moderately Isolated (02/21/2023)   Social Connection and Isolation Panel [NHANES]    Frequency of Communication with Friends and Family: Twice a week    Frequency of Social Gatherings with Friends and Family: Once a week    Attends Religious Services: 1 to 4 times per year  Active Member of Clubs or Organizations: No    Attends Engineer, structural: Not on file    Marital Status: Never married   Family History  Problem Relation Age of Onset   Diabetes Mother    Diabetes Father    Diabetes Maternal Grandmother         Objective:   Physical Exam Vitals reviewed.  Constitutional:      General: She is not in acute distress.    Appearance: She is well-developed.  HENT:     Head: Normocephalic and atraumatic.     Right Ear: Tympanic membrane normal.     Left Ear: Tympanic membrane normal.  Eyes:     Pupils:  Pupils are equal, round, and reactive to light.  Neck:     Thyroid: No thyromegaly.  Cardiovascular:     Rate and Rhythm: Normal rate and regular rhythm.     Heart sounds: Normal heart sounds. No murmur heard. Pulmonary:     Effort: Pulmonary effort is normal. No respiratory distress.     Breath sounds: Normal breath sounds. No wheezing.  Abdominal:     General: Bowel sounds are normal. There is no distension.     Palpations: Abdomen is soft.     Tenderness: There is no abdominal tenderness (no tenderness on exam).  Musculoskeletal:        General: No tenderness. Normal range of motion.     Cervical back: Normal range of motion and neck supple.  Skin:    General: Skin is warm and dry.  Neurological:     Mental Status: She is alert and oriented to person, place, and time.     Cranial Nerves: No cranial nerve deficit.     Deep Tendon Reflexes: Reflexes are normal and symmetric.  Psychiatric:        Behavior: Behavior normal.        Thought Content: Thought content normal.        Judgment: Judgment normal.      BP 122/79   Pulse 89   Temp 98 F (36.7 C) (Temporal)   Ht 5\' 4"  (1.626 m)   Wt 209 lb 12.8 oz (95.2 kg)   SpO2 99%   BMI 36.01 kg/m      Assessment & Plan:  Zarea N Brockel comes in today with chief complaint of Abdominal Pain (Started hurting last night and pain in left side radiating towards back patient thinks it is related to her shots )   Diagnosis and orders addressed:  1. Generalized abdominal pain (Primary) Rest Force fluids Negative urine and x-ray Labs pending  Could have been gas? No pain on exam when pushing Work note given  Follow up if symptoms worsen or do not improve  - Urinalysis, Complete - DG Abd 1 View; Future - BMP8+EGFR - CBC with Differential/Platelet    Jannifer Rodney, FNP

## 2023-05-29 NOTE — Patient Instructions (Signed)
 Abdominal Pain, Adult  Pain in the abdomen (abdominal pain) can be caused by many things. In most cases, it gets better with no treatment or by being treated at home. But in some cases, it can be serious. Your health care provider will ask questions about your medical history and do a physical exam to try to figure out what is causing your pain. Follow these instructions at home: Medicines Take over-the-counter and prescription medicines only as told by your provider. Do not take medicines that help you poop (laxatives) unless told by your provider. General instructions Watch your condition for any changes. Drink enough fluid to keep your pee (urine) pale yellow. Contact a health care provider if: Your pain changes, gets worse, or lasts longer than expected. You have severe cramping or bloating in your abdomen, or you vomit. Your pain gets worse with meals, after eating, or with certain foods. You are constipated or have diarrhea for more than 2-3 days. You are not hungry, or you lose weight without trying. You have signs of dehydration. These may include: Dark pee, very little pee, or no pee. Cracked lips or dry mouth. Sleepiness or weakness. You have pain when you pee (urinate) or poop. Your abdominal pain wakes you up at night. You have blood in your pee. You have a fever. Get help right away if: You cannot stop vomiting. Your pain is only in one part of the abdomen. Pain on the right side could be caused by appendicitis. You have bloody or black poop (stool), or poop that looks like tar. You have trouble breathing. You have chest pain. These symptoms may be an emergency. Get help right away. Call 911. Do not wait to see if the symptoms will go away. Do not drive yourself to the hospital. This information is not intended to replace advice given to you by your health care provider. Make sure you discuss any questions you have with your health care provider. Document Revised:  11/24/2021 Document Reviewed: 11/24/2021 Elsevier Patient Education  2024 [employer].

## 2023-05-29 NOTE — Telephone Encounter (Signed)
 Chief Complaint: abd pain Symptoms: 7/10 abd pain, intermittent diarrhea, "sulfur burps" Frequency: intermittent for 3 months after started Wegovy, worse today Pertinent Negatives: Patient denies vomiting, nausea, vomiting, Cp, SOB, fever Disposition: [] ED /[] Urgent Care (no appt availability in office) / [x] Appointment(In office/virtual)/ []  [facility]/ [] Home Care/ [] Refused Recommended Disposition /[]  Mobile Bus/ []  Follow-up with PCP Additional Notes: Pt states she awoke with 8/10 abd pain today that she now rates a 7/10. Pt states the pt is worse at the center of her abdomen and along both sides. Pt states she had L-sided abdominal cramping yesterday. Pt took 1mg  of semaglutide on Wednesday. Pt is concerned that this medication and dosage is not good for her. Pt states she started taking 1mg  3 months ago and she's had diarrhea with "sulfur burps." Pt endorses intermittent diarrhea now but states it is improving, last BM was yesterday. Pt denies nausea, vomiting, CP, sOB, fever. RN advised pt she should be seen today within 4 hours, pt agreeable to be seen in that timeframe and with the DOD at [facility]. RN scheduled pt for 4/7 at 1120. Pt agreeable to that plan. RN advised pt she needs to go to the ED if she gets worse, pt verbalized understanding.    Copied from CRM [id number]. Topic: Clinical - Red Word Triage >> May 29, 2023  9:29 AM Alessandra Bevels wrote: Red Word that prompted transfer to Nurse Triage: Patient is calling to report that she woke up with bad stomach pain. Patient states if this is associated with Semaglutide-Weight Management 1 MG/0.5ML SOAJ she wants to be taken off the shot. Please advise Reason for Disposition  [1] MILD-MODERATE pain AND [2] constant AND [3] present > 2 hours  Answer Assessment - Initial Assessment Questions 1. LOCATION: "Where does it hurt?"      Center abdominal pain "with a little bit on each side" 2. RADIATION: "Does the pain shoot  anywhere else?" (e.g., chest, back)     "I kind of feel it in my lower back"  3. ONSET: "When did the pain begin?" (e.g., minutes, hours or days ago)      Woke up this pain this AM 4. SUDDEN: "Gradual or sudden onset?"     Woke up this pain today, also endorses intermittent L abd cramps yesterday 5. PATTERN "Does the pain come and go, or is it constant?"    - If it comes and goes: "How long does it last?" "Do you have pain now?"     (Note: Comes and goes means the pain is intermittent. It goes away completely between bouts.)    - If constant: "Is it getting better, staying the same, or getting worse?"      (Note: Constant means the pain never goes away completely; most serious pain is constant and gets worse.)      "Steady pain" 6. SEVERITY: "How bad is the pain?"  (e.g., Scale 1-10; mild, moderate, or severe)    - MILD (1-3): Doesn't interfere with normal activities, abdomen soft and not tender to touch.     - MODERATE (4-7): Interferes with normal activities or awakens from sleep, abdomen tender to touch.     - SEVERE (8-10): Excruciating pain, doubled over, unable to do any normal activities.       8/10 when she awoke this AM, 7/10 now. 7. RECURRENT SYMPTOM: "Have you ever had this type of stomach pain before?" If Yes, ask: "When was the last time?" and "What happened that time?"  Been taking Wegovy for 3 months, states she had abdominal pain before after taking it but it was never this bad. Indigestion problems since taking it. 8. CAUSE: "What do you think is causing the stomach pain?"     Wegovy 9. RELIEVING/AGGRAVATING FACTORS: "What makes it better or worse?" (e.g., antacids, bending or twisting motion, bowel movement)     Pain feels better lying down, pain is worse with walking. Pt called out of work. 10. OTHER SYMPTOMS: "Do you have any other symptoms?" (e.g., back pain, diarrhea, fever, urination pain, vomiting)       Took Wegovy shot on Wednesday. Pt endorses "sharp pain  cramps" going through her side. Pt states she had these cramps on her L middle abdomen yesterday. Pt doesn't know if Reginal Lutes is good for her, states especially on 1mg  she is feeling bad. No diarrhea today, states she did have it real bad but it slowed down. No N/V. No CP. No urinary symptoms. No difficulty breathing. No bloody stools. Endorses lower back pain that started with the abd pain. No fever. Having "sulfur burps", states those started with taking 1 mg 3 wks ago. Pt states her last BM was yesterday. "Most of the time it is diarrhea, I've had a couple solids recently"  Taking Pepcid AC and peptobismol, not helping  Protocols used: Abdominal Pain - [facility]

## 2023-05-30 LAB — CBC WITH DIFFERENTIAL/PLATELET
Basophils Absolute: 0.1 10*3/uL (ref 0.0–0.2)
Basos: 1 %
EOS (ABSOLUTE): 0.1 10*3/uL (ref 0.0–0.4)
Eos: 2 %
Hematocrit: 41.1 % (ref 34.0–46.6)
Hemoglobin: 13.4 g/dL (ref 11.1–15.9)
Immature Grans (Abs): 0 10*3/uL (ref 0.0–0.1)
Immature Granulocytes: 0 %
Lymphocytes Absolute: 2 10*3/uL (ref 0.7–3.1)
Lymphs: 35 %
MCH: 26.6 pg (ref 26.6–33.0)
MCHC: 32.6 g/dL (ref 31.5–35.7)
MCV: 82 fL (ref 79–97)
Monocytes Absolute: 0.3 10*3/uL (ref 0.1–0.9)
Monocytes: 5 %
Neutrophils Absolute: 3.3 10*3/uL (ref 1.4–7.0)
Neutrophils: 57 %
Platelets: 360 10*3/uL (ref 150–450)
RBC: 5.04 x10E6/uL (ref 3.77–5.28)
RDW: 13.6 % (ref 11.7–15.4)
WBC: 5.8 10*3/uL (ref 3.4–10.8)

## 2023-05-30 LAB — BMP8+EGFR
BUN/Creatinine Ratio: 11 (ref 9–23)
BUN: 8 mg/dL (ref 6–20)
CO2: 19 mmol/L — ABNORMAL LOW (ref 20–29)
Calcium: 9.2 mg/dL (ref 8.7–10.2)
Chloride: 105 mmol/L (ref 96–106)
Creatinine, Ser: 0.75 mg/dL (ref 0.57–1.00)
Glucose: 74 mg/dL (ref 70–99)
Potassium: 4.4 mmol/L (ref 3.5–5.2)
Sodium: 139 mmol/L (ref 134–144)
eGFR: 108 mL/min/{1.73_m2} (ref >59)

## 2023-06-01 ENCOUNTER — Encounter: Payer: Self-pay | Admitting: Family Medicine

## 2023-06-02 ENCOUNTER — Other Ambulatory Visit: Payer: Self-pay | Admitting: Family Medicine

## 2023-06-02 DIAGNOSIS — F32A Depression, unspecified: Secondary | ICD-10-CM

## 2023-06-05 ENCOUNTER — Other Ambulatory Visit: Payer: Self-pay | Admitting: Family Medicine

## 2023-06-05 DIAGNOSIS — F32A Depression, unspecified: Secondary | ICD-10-CM

## 2023-06-06 ENCOUNTER — Telehealth: Payer: Self-pay | Admitting: Family Medicine

## 2023-06-06 NOTE — Telephone Encounter (Signed)
 Called and spoke with patient and advised that PCP is out of the office this week. Advised patient to go ahead with [facility] shot this week and we would contact her back next week once her PCP reviews.

## 2023-06-06 NOTE — Telephone Encounter (Signed)
 Copied from CRM [id number]. Topic: Clinical - Medical Advice >> Jun 06, 2023 10:26 AM Jyl Or S wrote: Reason for CRM: Patient is on Wegovy and she feels its not working and wants to try ozempic instead. Please follow up with patient  Insurance Info to send Siegfried Dress [phone] Fax: [phone]

## 2023-07-03 ENCOUNTER — Encounter: Payer: Self-pay | Admitting: Family Medicine

## 2023-07-03 ENCOUNTER — Ambulatory Visit: Admitting: Family Medicine

## 2023-07-03 ENCOUNTER — Telehealth: Payer: Self-pay

## 2023-07-03 VITALS — BP 127/94 | HR 85 | Temp 97.9°F | Ht 64.0 in | Wt 217.0 lb

## 2023-07-03 DIAGNOSIS — F419 Anxiety disorder, unspecified: Secondary | ICD-10-CM | POA: Diagnosis not present

## 2023-07-03 DIAGNOSIS — E669 Obesity, unspecified: Secondary | ICD-10-CM | POA: Insufficient documentation

## 2023-07-03 DIAGNOSIS — F32A Depression, unspecified: Secondary | ICD-10-CM

## 2023-07-03 MED ORDER — ZEPBOUND 7.5 MG/0.5ML ~~LOC~~ SOAJ: 7.5000 mg | SUBCUTANEOUS | 3 refills | Status: DC

## 2023-07-03 NOTE — Telephone Encounter (Signed)
 Pharmacy Patient Advocate Encounter   Received notification from CoverMyMeds that prior authorization for Zepbound 7.5MG /0.5ML pen-injectors is required/requested.   Insurance verification completed.   The patient is insured through [facility] .   Per test claim: PA required; PA submitted to above mentioned insurance via CoverMyMeds Key/confirmation #/EOC [id number] Status is pending

## 2023-07-03 NOTE — Progress Notes (Signed)
 BP (!) 127/94   Pulse 85   Temp 97.9 F (36.6 C)   Ht 5\' 4"  (1.626 m)   Wt 217 lb (98.4 kg)   SpO2 100%   BMI 37.25 kg/m    Subjective:   Patient ID: Amanda Lam, female    DOB: 03-25-1990, 33 y.o.   MRN: [id number]  HPI: Amanda Lam is a 33 y.o. female presenting on 07/03/2023 for Weight Check (Patient wants to talk about getting off of depression medication.)   HPI Obesity and weight recheck Patient is coming in today for obesity and weight recheck.  She has been off of the medication for 3 weeks but she only got up to the 1 mg dose of the Wegovy  because she had a lot of stomach issues.  She would like to try something different, we discussed the possibility of Zepbound and she would like to try that.  She has been focusing on trying to reduce portion sizing and now wants to focus on changing to better healthier foods and also trying to increase her activity.  Anxiety depression Patient feels like she has been doing really well on anxiety and depression medicines and she is seeing a counselor starting tomorrow.  She would like to try to start backing off of medications and going more natural.  She has been off of both of her medications for about 3 days.  She denies any suicidal ideations or thoughts of hurting self.    07/03/2023    1:10 PM 05/22/2023    2:43 PM 03/08/2023    3:52 PM 03/08/2023    3:51 PM 01/02/2023    2:39 PM  Depression screen PHQ 2/9  Decreased Interest 1 1 1 1 2   Down, Depressed, Hopeless 1 1 1 1 2   PHQ - 2 Score 2 2 2 2 4   Altered sleeping 0 1 0 0 1  Tired, decreased energy 1 3 3 3 3   Change in appetite 1 1 3 3 2   Feeling bad or failure about yourself  0 0 1 1 1   Trouble concentrating 0 0 0 0 0  Moving slowly or fidgety/restless 0 0 0 0 0  Suicidal thoughts 0 0 0 0 0  PHQ-9 Score 4 7 9 9 11   Difficult doing work/chores Somewhat difficult Somewhat difficult Somewhat difficult Somewhat difficult Somewhat difficult     Relevant past medical,  surgical, family and social history reviewed and updated as indicated. Interim medical history since our last visit reviewed. Allergies and medications reviewed and updated.  Review of Systems  Constitutional:  Positive for unexpected weight change. Negative for chills and fever.  Eyes:  Negative for redness and visual disturbance.  Respiratory:  Negative for chest tightness and shortness of breath.   Cardiovascular:  Negative for chest pain and leg swelling.  Musculoskeletal:  Negative for back pain and gait problem.  Skin:  Negative for rash.  Neurological:  Negative for dizziness, light-headedness and headaches.  Psychiatric/Behavioral:  Negative for agitation, behavioral problems and dysphoric mood. The patient is not nervous/anxious.   All other systems reviewed and are negative.   Per HPI unless specifically indicated above   Allergies as of 07/03/2023       Reactions   Penicillins Anaphylaxis        Medication List        Accurate as of Jul 03, 2023  1:29 PM. If you have any questions, ask your nurse or doctor.  STOP taking these medications    bacitracin  500 UNIT/GM ointment Stopped by: Lucio Sabin Arath Kaigler   desvenlafaxine  50 MG 24 hr tablet Commonly known as: PRISTIQ  Stopped by: Lucio Sabin Devlynn Knoff   ondansetron  4 MG disintegrating tablet Commonly known as: ZOFRAN -ODT Stopped by: Lucio Sabin Lorie Cleckley   Semaglutide -Weight Management 2.4 MG/0.75ML Soaj Stopped by: Lucio Sabin Unnamed Zeien       TAKE these medications    acetaminophen 500 MG tablet Commonly known as: TYLENOL Take by mouth.   buPROPion  300 MG 24 hr tablet Commonly known as: WELLBUTRIN  XL Take 1 tablet (300 mg total) by mouth daily.   Sronyx  0.1-20 MG-MCG tablet Generic drug: levonorgestrel-ethinyl estradiol Take 1 tablet by mouth daily.   Zepbound 7.5 MG/0.5ML Pen Generic drug: tirzepatide Inject 7.5 mg into the skin once a week. Started by: Lucio Sabin Chrisotpher Rivero          Objective:   BP (!) 127/94   Pulse 85   Temp 97.9 F (36.6 C)   Ht 5\' 4"  (1.626 m)   Wt 217 lb (98.4 kg)   SpO2 100%   BMI 37.25 kg/m   Wt Readings from Last 3 Encounters:  07/03/23 217 lb (98.4 kg)  05/29/23 209 lb 12.8 oz (95.2 kg)  05/22/23 208 lb (94.3 kg)    Physical Exam Vitals and nursing note reviewed.  Constitutional:      General: She is not in acute distress.    Appearance: She is well-developed. She is not diaphoretic.  Eyes:     Conjunctiva/sclera: Conjunctivae normal.  Cardiovascular:     Rate and Rhythm: Normal rate and regular rhythm.     Heart sounds: Normal heart sounds. No murmur heard. Pulmonary:     Effort: Pulmonary effort is normal. No respiratory distress.     Breath sounds: Normal breath sounds. No wheezing.  Musculoskeletal:        General: No swelling.  Skin:    General: Skin is warm and dry.     Findings: No rash.  Neurological:     Mental Status: She is alert and oriented to person, place, and time.     Coordination: Coordination normal.  Psychiatric:        Behavior: Behavior normal.       Assessment & Plan:   Problem List Items Addressed This Visit       Other   Anxiety and depression - Primary   Obesity (BMI 35.0-39.9 without comorbidity)   Relevant Medications   tirzepatide (ZEPBOUND) 7.5 MG/0.5ML Pen    Patient's BMI is >30 mg/m2.  Patient's current BMI is Body mass index is 37.25 kg/m.Aaron Aas  Patient is currently enrolled in a healthy eating plan along with encouraged exercise.  Slightly elevated blood pressure patient has contraindications to phentermine, Contrave & Qsymia (contains phentermine).  Patient does not have a personal or family history of medullary thyroid  carcinoma (MTC) or Multiple Endocrine Neoplasia syndrome type 2 (MEN 2).   She is already tried the Wegovy  and had a lot of stomach issues and would like to try Zepbound. Follow up plan: Return in about 3 months (around 10/03/2023), or if symptoms worsen  or fail to improve, for Anxiety depression and weight recheck.  Counseling provided for all of the vaccine components No orders of the defined types were placed in this encounter.   Jolyne Needs, MD [facility] 07/03/2023, 1:29 PM

## 2023-07-04 NOTE — Telephone Encounter (Signed)
 Pharmacy Patient Advocate Encounter  Received notification from [facility] that Prior Authorization for Zepbound 7.5MG /0.5ML pen-injectors  has been DENIED.  Full denial letter will be uploaded to the media tab. See denial reason below.   PA #/Case ID/Reference #: [id number]

## 2023-07-07 DIAGNOSIS — F4321 Adjustment disorder with depressed mood: Secondary | ICD-10-CM | POA: Diagnosis not present

## 2023-07-10 ENCOUNTER — Telehealth: Payer: Self-pay

## 2023-07-10 NOTE — Telephone Encounter (Signed)
 Copied from CRM [id number]. Topic: Clinical - Medication Prior Auth >> Jul 10, 2023  8:55 AM Albertha Alosa wrote: Reason for CRM: Patient called in regarding prior auth for Zepbound , patient stated she called her insurance to ask why it was denied and they stated they need a reason on why her previous medication wasn't working   [phone]

## 2023-07-12 ENCOUNTER — Telehealth: Payer: Self-pay

## 2023-07-12 NOTE — Telephone Encounter (Signed)
 Information has been sent to clinical pharmacist for appeals review. It may take 5-7 days to prepare the necessary documentation to request the appeal from the insurance.

## 2023-07-12 NOTE — Telephone Encounter (Signed)
 Pharmacy Patient Advocate Encounter   Received notification from Pt Calls Messages that prior authorization for Zepbound  7.5MG /0.5ML pen-injectors is required/requested.   Insurance verification completed.   The patient is insured through [facility] .   Per test claim: PA required; PA submitted to above mentioned insurance via CoverMyMeds Key/confirmation #/EOC [id number] Status is pending

## 2023-07-12 NOTE — Telephone Encounter (Signed)
 Pharmacy Patient Advocate Encounter  Received notification from [facility] that Prior Authorization for Zepbound  7.5MG /0.5ML pen-injectors  has been CANCELLED due to: PREVIOUS DENIAL

## 2023-07-12 NOTE — Telephone Encounter (Signed)
 Called and let pt know the update on PA - let her know we would call as soon as we get a updated answer

## 2023-07-12 NOTE — Telephone Encounter (Signed)
 Dr. Frankie Israel OV note from 07/03/23 states that Wegovy  caused " stomach issues" and pt felt like the medication was not helping her. Please appeal.

## 2023-07-12 NOTE — Telephone Encounter (Signed)
 PA request has been RESUBMITTED. New Encounter has been or will be created for follow up. For additional info see Pharmacy Prior Auth telephone encounter from 07/12/23.

## 2023-07-13 ENCOUNTER — Telehealth: Payer: Self-pay | Admitting: Pharmacist

## 2023-07-13 NOTE — Telephone Encounter (Signed)
 Appeal has been submitted for Zepbound . Will advise when response is received, please be advised that most companies may take 30 days to make a decision. Appeal letter and supporting documentation have been faxed to [phone] on 07/13/2023 @3 :51 pm.  Thank you, Dene Fines, PharmD Clinical Pharmacist  [place]  Direct Dial: [phone]

## 2023-07-18 ENCOUNTER — Encounter: Payer: Self-pay | Admitting: Family Medicine

## 2023-07-18 ENCOUNTER — Other Ambulatory Visit (HOSPITAL_COMMUNITY): Payer: Self-pay

## 2023-07-18 DIAGNOSIS — E669 Obesity, unspecified: Secondary | ICD-10-CM

## 2023-07-19 ENCOUNTER — Telehealth: Payer: Self-pay

## 2023-07-19 ENCOUNTER — Other Ambulatory Visit (HOSPITAL_COMMUNITY): Payer: Self-pay

## 2023-07-19 MED ORDER — TIRZEPATIDE-WEIGHT MANAGEMENT 2.5 MG/0.5ML ~~LOC~~ SOLN: 2.5000 mg | SUBCUTANEOUS | 1 refills | Status: DC

## 2023-07-19 NOTE — Telephone Encounter (Signed)
Appeal has been submitted. Will advise when response is received or follow up in 1 week. Please be advised that most companies may take 30 days to make a decision.

## 2023-07-21 ENCOUNTER — Other Ambulatory Visit (HOSPITAL_COMMUNITY): Payer: Self-pay

## 2023-07-24 MED ORDER — SEMAGLUTIDE-WEIGHT MANAGEMENT 2.4 MG/0.75ML ~~LOC~~ SOAJ: 2.4000 mg | SUBCUTANEOUS | 0 refills | Status: DC

## 2023-07-24 MED ORDER — SEMAGLUTIDE-WEIGHT MANAGEMENT 1 MG/0.5ML ~~LOC~~ SOAJ: 1.0000 mg | SUBCUTANEOUS | 0 refills | Status: DC

## 2023-07-24 MED ORDER — SEMAGLUTIDE-WEIGHT MANAGEMENT 1.7 MG/0.75ML ~~LOC~~ SOAJ: 1.7000 mg | SUBCUTANEOUS | 0 refills | Status: DC

## 2023-07-24 MED ORDER — SEMAGLUTIDE-WEIGHT MANAGEMENT 0.5 MG/0.5ML ~~LOC~~ SOAJ
0.5000 mg | SUBCUTANEOUS | 0 refills | Status: AC
Start: 2023-08-22 — End: 2023-09-19

## 2023-07-24 MED ORDER — SEMAGLUTIDE-WEIGHT MANAGEMENT 0.25 MG/0.5ML ~~LOC~~ SOAJ: 0.2500 mg | SUBCUTANEOUS | 0 refills | Status: AC

## 2023-07-24 NOTE — Addendum Note (Signed)
 Addended by: Jolyne Needs on: 07/24/2023 11:27 AM   Modules accepted: Orders

## 2023-07-25 ENCOUNTER — Other Ambulatory Visit: Payer: Self-pay | Admitting: Family Medicine

## 2023-07-25 NOTE — Telephone Encounter (Signed)
 WEGOVY  0.5 MG/0.5ML SOAJ   Pharmacy comment: Alternative Requested:PLEASE CONTACT INSURNACE - AFTER DISCUSSING WITH PATIENT, SHE WOULD LIKE TO START BACK ON A LOWER DOSE SINCE SHE HAS NOT HAD IN OVER A MONTH. INSURNACE WILL NOT PAY W/O SPEAKING WITH MD.

## 2023-07-26 NOTE — Telephone Encounter (Signed)
 Pt aware that the initial dose of 0.25 mg was sent to pharmacy on 07/24/23, she picked up the 1 mg dose the other day Pharmacy said the 1 mg was the first Rx that came across to them so this is why they filled it first and looking at her profile she was on it last. They will try to contact ins and explain to them what happened and get an override to get the lower dose covered to get it filled but this may not happen and she may have to wait 3 weeks before being able to get it filled. If she has to wait the 3 weeks, she will have to just hold onto the 1 mg dose. Explained all of this to the patient

## 2023-07-27 ENCOUNTER — Other Ambulatory Visit (HOSPITAL_COMMUNITY): Payer: Self-pay

## 2023-07-31 ENCOUNTER — Other Ambulatory Visit (HOSPITAL_COMMUNITY): Payer: Self-pay

## 2023-08-10 ENCOUNTER — Other Ambulatory Visit (HOSPITAL_COMMUNITY): Payer: Self-pay

## 2023-08-10 ENCOUNTER — Telehealth: Payer: Self-pay

## 2023-08-10 NOTE — Telephone Encounter (Signed)
 Pharmacy Patient Advocate Encounter   Received notification from CoverMyMeds that prior authorization for Zepbound  is required/requested.   Insurance verification completed.   The patient is insured through [facility] .   Per test claim: appeal is still pending. Please see encounter 07/19/23, 07/13/23

## 2023-08-11 ENCOUNTER — Other Ambulatory Visit (HOSPITAL_COMMUNITY): Payer: Self-pay

## 2023-08-14 ENCOUNTER — Other Ambulatory Visit (HOSPITAL_COMMUNITY): Payer: Self-pay

## 2023-08-14 NOTE — Telephone Encounter (Signed)
 Appeal has been approved for Zepbound  by the insurance.  They stated a letter was sent to the patient.

## 2023-08-14 NOTE — Telephone Encounter (Signed)
 Made pt aware but she states that she has been taking Wegovy  for two weeks and is doing well. She had to switch the injection site from her abdomen to her arm and is doing better taking it that way.  Pt would like to see if Dr. Maryanne feels that she needs to switch to the Zepbound . She is aware that he will not be able to address the message until next week.

## 2023-08-21 ENCOUNTER — Other Ambulatory Visit (HOSPITAL_COMMUNITY): Payer: Self-pay

## 2023-08-21 NOTE — Telephone Encounter (Signed)
 Zepbound  appeal approval letter in media.

## 2023-08-23 NOTE — Telephone Encounter (Signed)
 I would say that if she is doing well on the Wegovy  and she got that covered then have her stay on that.

## 2023-08-24 ENCOUNTER — Other Ambulatory Visit (HOSPITAL_COMMUNITY): Payer: Self-pay

## 2023-09-27 ENCOUNTER — Other Ambulatory Visit: Payer: Self-pay | Admitting: Medical Genetics

## 2023-10-04 ENCOUNTER — Other Ambulatory Visit: Payer: Self-pay

## 2023-10-04 ENCOUNTER — Ambulatory Visit: Admitting: Family Medicine

## 2023-10-04 ENCOUNTER — Encounter: Payer: Self-pay | Admitting: Family Medicine

## 2023-10-04 ENCOUNTER — Other Ambulatory Visit: Payer: Self-pay | Admitting: Family Medicine

## 2023-10-04 VITALS — BP 133/83 | HR 79 | Ht 64.0 in | Wt 230.0 lb

## 2023-10-04 DIAGNOSIS — E669 Obesity, unspecified: Secondary | ICD-10-CM

## 2023-10-04 DIAGNOSIS — Z3009 Encounter for other general counseling and advice on contraception: Secondary | ICD-10-CM | POA: Diagnosis not present

## 2023-10-04 DIAGNOSIS — F419 Anxiety disorder, unspecified: Secondary | ICD-10-CM

## 2023-10-04 DIAGNOSIS — F32A Depression, unspecified: Secondary | ICD-10-CM

## 2023-10-04 MED ORDER — VORTIOXETINE HBR 10 MG PO TABS: 10.0000 mg | ORAL_TABLET | Freq: Every day | ORAL | 2 refills | Status: DC

## 2023-10-04 NOTE — Progress Notes (Signed)
 BP 133/83   Pulse 79   Ht 5' 4 (1.626 m)   Wt 230 lb (104.3 kg)   SpO2 99%   BMI 39.48 kg/m    Subjective:   Patient ID: Amanda Lam, female    DOB: 01-Mar-1990, 33 y.o.   MRN: [id number]  HPI: Amanda Lam is a 33 y.o. female presenting on 10/04/2023 for Medical Management of Chronic Issues, Anxiety, Depression, and Weight Check   Discussed the use of AI scribe software for clinical note transcription with the patient, who gave verbal consent to proceed.  History of Present Illness   Amanda Lam is a 33 year old female who presents for a three-month follow-up visit.  She has been off Wellbutrin  and is experiencing increased anxiety, irritability, mood swings, and sadness. Her energy levels are very low, and she feels it couldn't get worse. No thoughts of self-harm or suicide. She has previously tried Pristiq  and Zoloft  without success. She attempted therapy but did not receive a follow-up call after her initial visit.  She has experienced significant weight gain since her last visit. She discontinued Wegovy  due to feeling sick. She feels out of breath and is not accustomed to this sensation.  She inquires about B12 shots and is interested in checking her B12 levels. She is currently taking birth control consistently.          Relevant past medical, surgical, family and social history reviewed and updated as indicated. Interim medical history since our last visit reviewed. Allergies and medications reviewed and updated.  Review of Systems  Constitutional:  Positive for unexpected weight change. Negative for chills and fever.  Eyes:  Negative for visual disturbance.  Respiratory:  Negative for chest tightness and shortness of breath.   Cardiovascular:  Negative for chest pain and leg swelling.  Skin:  Negative for rash.  Neurological:  Negative for light-headedness and headaches.  Psychiatric/Behavioral:  Positive for dysphoric mood. Negative for agitation, behavioral  problems, self-injury, sleep disturbance and suicidal ideas. The patient is nervous/anxious.   All other systems reviewed and are negative.   Per HPI unless specifically indicated above   Allergies as of 10/04/2023       Reactions   Penicillins Anaphylaxis        Medication List        Accurate as of October 04, 2023  2:28 PM. If you have any questions, ask your nurse or doctor.          STOP taking these medications    buPROPion  300 MG 24 hr tablet Commonly known as: WELLBUTRIN  XL Stopped by: [facility] LABOR Arleigh Dicola   semaglutide -weight management 1 MG/0.5ML Soaj SQ injection Commonly known as: WEGOVY  Stopped by: [facility] LABOR Johnel Yielding   semaglutide -weight management 1.7 MG/0.75ML Soaj SQ injection Commonly known as: WEGOVY  Stopped by: [facility] LABOR Hamdi Vari   semaglutide -weight management 2.4 MG/0.75ML Soaj SQ injection Commonly known as: WEGOVY  Stopped by: [facility] LAB[facility] Chanee Henrickson       TAKE these medications    acetaminophen 500 MG tablet Commonly known as: TYLENOL Take by mouth.   Sronyx  0.1-20 MG-MCG tablet Generic drug: levonorgestrel-ethinyl estradiol Take 1 tablet by mouth daily.   vortioxetine  HBr 10 MG Tabs tablet Commonly known as: TRINTELLIX  Take 1 tablet (10 mg total) by mouth daily. Started by: [facility] Anirudh Baiz   Zepbound  7.5 MG/0.5ML Pen Generic drug: tirzepatide  Inject 7.5 mg into the skin once a week.   tirzepatide  2.5 MG/0.5ML injection vial Commonly known as: ZEPBOUND  Inject  2.5 mg into the skin once a week.         Objective:   BP 133/83   Pulse 79   Ht 5' 4 (1.626 m)   Wt 230 lb (104.3 kg)   SpO2 99%   BMI 39.48 kg/m   Wt Readings from Last 3 Encounters:  10/04/23 230 lb (104.3 kg)  07/03/23 217 lb (98.4 kg)  05/29/23 209 lb 12.8 oz (95.2 kg)    Physical Exam Physical Exam   NECK: Thyroid  normal on palpation. CHEST: Lungs clear to auscultation bilaterally. CARDIOVASCULAR: Heart regular rate and rhythm, normal heart  sounds.         Assessment & Plan:   Problem List Items Addressed This Visit       Other   Anxiety and depression - Primary   Relevant Medications   vortioxetine  HBr (TRINTELLIX ) 10 MG TABS tablet   Obesity (BMI 35.0-39.9 without comorbidity)   Other Visit Diagnoses       Counseling for birth control, oral contraceptives               Depression and anxiety Increased anxiety and depression after stopping Wellbutrin . Previous medications ineffective. Counseling not pursued due to therapist unavailability. Trintellix  considered for efficacy and minimal side effects. - Prescribe Trintellix , start 5 mg for one week, then increase to 10 mg. - Advise finding a therapist covered by insurance for adjunct counseling. - Patient has already tried Zoloft  and trazodone  and Lexapro  and Pristiq  and Wellbutrin   Obesity Significant weight gain with dyspnea. Wegovy  discontinued due to side effects. Zepbound  approved and may be better tolerated. - Contact pharmacy to fill Zepbound  prescription, start at 2.5 mg.  Contraceptive management Consistent birth control use. Discussed adherence importance, especially with weight loss medications increasing fertility. - Continue current birth control regimen. - Emphasize consistent use to prevent pregnancy.  Vitamin B12 status monitoring B12 levels not recently checked. Discussed dietary sources and oral supplementation options. - Order B12 level test. - Consider over-the-counter B complex or B12 supplements. - Discuss dietary sources of B vitamins, such as leafy greens.          Follow up plan: Return if symptoms worsen or fail to improve, for 1 to 54-month anxiety depression.  Counseling provided for all of the vaccine components No orders of the defined types were placed in this encounter.   Fonda Levins, MD [facility] 10/04/2023, 2:28 PM

## 2023-10-05 ENCOUNTER — Telehealth: Payer: Self-pay | Admitting: Family Medicine

## 2023-10-05 NOTE — Telephone Encounter (Signed)
 Copied from CRM [id number]. Topic: Clinical - Prescription Issue >> Oct 05, 2023 11:48 AM Amanda Lam wrote: Reason for CRM: Patient requested refill of ondansetron  (ZOFRAN -ODT) 4 MG disintegrating tablet and was denied. Starting her Zepbound  again next week and stated she wanted to have the medication just in case she needs it.  Patient can be reached at [phone]

## 2023-10-05 NOTE — Telephone Encounter (Signed)
 Yes please do a prior authorization

## 2023-10-05 NOTE — Telephone Encounter (Signed)
 Do you want to do a PA or try another nausea med?

## 2023-10-06 ENCOUNTER — Other Ambulatory Visit (HOSPITAL_COMMUNITY): Payer: Self-pay

## 2023-10-06 MED ORDER — ONDANSETRON 4 MG PO TBDP: 4.0000 mg | ORAL_TABLET | Freq: Three times a day (TID) | ORAL | 3 refills | Status: AC | PRN

## 2023-10-06 NOTE — Addendum Note (Signed)
 Addended by: LEIGH ROSINA SAILOR on: 10/06/2023 12:55 PM   Modules accepted: Orders

## 2023-10-06 NOTE — Telephone Encounter (Signed)
 Added Zofran  ODT 4mg  to pts med list. Please move forward with PA. Thank you.

## 2023-10-09 ENCOUNTER — Other Ambulatory Visit (HOSPITAL_COMMUNITY): Payer: Self-pay

## 2023-10-09 ENCOUNTER — Telehealth: Payer: Self-pay

## 2023-10-09 NOTE — Telephone Encounter (Signed)
 Per test claim: Refill too soon. PA is not needed at this time. Medication was filled 10/06/23. Next eligible fill date is 10/13/23.

## 2023-10-09 NOTE — Telephone Encounter (Signed)
 Pharmacy Patient Advocate Encounter   Received notification from Pt Calls Messages that prior authorization for ONDANSETRON  is required/requested.   Insurance verification completed.   The patient is insured through [facility] .   Per test claim: Refill too soon. PA is not needed at this time. Medication was filled 10/06/23. Next eligible fill date is 10/13/23.

## 2023-11-01 ENCOUNTER — Ambulatory Visit: Admitting: Family Medicine

## 2023-11-13 ENCOUNTER — Other Ambulatory Visit: Payer: Self-pay | Admitting: Family Medicine

## 2023-11-13 DIAGNOSIS — E669 Obesity, unspecified: Secondary | ICD-10-CM

## 2023-11-13 NOTE — Telephone Encounter (Signed)
  ZEPBOUND  2.5 MG/0.5ML Pen   Pharmacy comment: Script Clarification:PT SAYS SHE'S SUPPOSED TO CHANGE TO ZEPBOUND  5 MG BUT WE DO NOT HAVE SCRIPT ON FILE -- WE DO HAVE 7.5 MG BUT CANNOT JUMP DOSES.

## 2023-11-15 NOTE — Telephone Encounter (Signed)
 I sent Zepbound  5 for her

## 2023-11-16 MED ORDER — TIRZEPATIDE-WEIGHT MANAGEMENT 5 MG/0.5ML ~~LOC~~ SOLN: 5.0000 mg | SUBCUTANEOUS | 1 refills | Status: DC

## 2023-11-16 NOTE — Telephone Encounter (Signed)
 Refill failed. resent

## 2023-11-23 ENCOUNTER — Ambulatory Visit: Admitting: Family Medicine

## 2023-12-01 ENCOUNTER — Other Ambulatory Visit: Payer: Self-pay | Admitting: Medical Genetics

## 2023-12-01 ENCOUNTER — Encounter: Payer: Self-pay | Admitting: *Deleted

## 2023-12-01 DIAGNOSIS — Z006 Encounter for examination for normal comparison and control in clinical research program: Secondary | ICD-10-CM

## 2023-12-22 LAB — GENECONNECT MOLECULAR SCREEN: Genetic Analysis Overall Interpretation: NEGATIVE

## 2023-12-27 ENCOUNTER — Ambulatory Visit: Payer: Self-pay | Admitting: Family Medicine

## 2023-12-27 ENCOUNTER — Encounter: Payer: Self-pay | Admitting: Family Medicine

## 2023-12-27 VITALS — BP 116/75 | HR 76 | Ht 64.0 in | Wt 226.0 lb

## 2023-12-27 DIAGNOSIS — M722 Plantar fascial fibromatosis: Secondary | ICD-10-CM

## 2023-12-27 DIAGNOSIS — F419 Anxiety disorder, unspecified: Secondary | ICD-10-CM | POA: Diagnosis not present

## 2023-12-27 DIAGNOSIS — F339 Major depressive disorder, recurrent, unspecified: Secondary | ICD-10-CM | POA: Diagnosis not present

## 2023-12-27 DIAGNOSIS — F32A Depression, unspecified: Secondary | ICD-10-CM

## 2023-12-27 MED ORDER — VORTIOXETINE HBR 20 MG PO TABS
20.0000 mg | ORAL_TABLET | Freq: Every day | ORAL | 2 refills | Status: AC
Start: 2023-12-27 — End: ?

## 2023-12-27 NOTE — Progress Notes (Signed)
 BP 116/75   Pulse 76   Ht 5' 4 (1.626 m)   Wt 226 lb (102.5 kg)   SpO2 100%   BMI 38.79 kg/m    Subjective:   Patient ID: Amanda Lam, female    DOB: 07/02/1990, 33 y.o.   MRN: [id number]  HPI: Amanda Lam is a 33 y.o. female presenting on 12/27/2023 for Medical Management of Chronic Issues, Anxiety, and Depression   Discussed the use of AI scribe software for clinical note transcription with the patient, who gave verbal consent to proceed.  History of Present Illness   Amanda Lam is a 33 year old female who presents for a recheck of her medication regimen. She is accompanied by her daughter.  Depressive symptoms - Started Trintellix, increased dose to 10 mg, but finds it ineffective for mood symptoms - Experiences guilt and feelings of inadequacy, particularly regarding her daughter - No thoughts of self-harm - Feels tired and lacks motivation, impacting ability to care for her daughter  Adverse effects of trintellix - Experienced nausea and vomiting once when taken without food - No side effects when taken with food  Heel pain - Heel pain is most pronounced in the mornings when stepping down - Pain improves slightly throughout the day - Wearing steel toe shoes at work exacerbates pain - Exercises such as stretching and rolling a frozen water bottle under her foot provide some relief but do not resolve pain - At home, typically barefoot or wears Crocs           11 /06/2023    1:06 PM 10/04/2023    2:07 PM 07/03/2023    1:10 PM 05/22/2023    2:43 PM 03/08/2023    3:52 PM  Depression screen PHQ 2/9  Decreased Interest 2 1 1 1 1   Down, Depressed, Hopeless 2 1 1 1 1   PHQ - 2 Score 4 2 2 2 2   Altered sleeping 1 1 0 1 0  Tired, decreased energy 2 2 1 3 3   Change in appetite 2 2 1 1 3   Feeling bad or failure about yourself  0 0 0 0 1  Trouble concentrating 0 0 0 0 0  Moving slowly or fidgety/restless 0 0 0 0 0  Suicidal thoughts 0 0 0 0 0  PHQ-9 Score 9 7 4 7 9    Difficult doing work/chores Somewhat difficult Somewhat difficult Somewhat difficult Somewhat difficult Somewhat difficult      Relevant past medical, surgical, family and social history reviewed and updated as indicated. Interim medical history since our last visit reviewed. Allergies and medications reviewed and updated.  Review of Systems  Constitutional:  Negative for chills and fever.  Eyes:  Negative for visual disturbance.  Respiratory:  Negative for chest tightness and shortness of breath.   Cardiovascular:  Negative for chest pain and leg swelling.  Genitourinary:  Negative for difficulty urinating and dysuria.  Musculoskeletal:  Negative for back pain and gait problem.  Skin:  Negative for rash.  Neurological:  Negative for light-headedness and headaches.  Psychiatric/Behavioral:  Positive for dysphoric mood. Negative for agitation, behavioral problems, self-injury, sleep disturbance and suicidal ideas. The patient is nervous/anxious.   All other systems reviewed and are negative.   Per HPI unless specifically indicated above   Allergies as of 12/27/2023       Reactions   Penicillins Anaphylaxis        Medication List        Accurate as of  December 27, 2023  1:21 PM. If you have any questions, ask your nurse or doctor.          STOP taking these medications    tirzepatide  5 MG/0.5ML injection vial Stopped by: [facility]onda LABOR Oriya Kettering   Zepbound  7.5 MG/0.5ML Pen Generic drug: tirzepatide  Stopped by: [facility] LABOR Lyndsie Wallman       TAKE these medications    acetaminophen 500 MG tablet Commonly known as: TYLENOL Take by mouth.   ondansetron  4 MG disintegrating tablet Commonly known as: ZOFRAN -ODT Take 1 tablet (4 mg total) by mouth every 8 (eight) hours as needed for nausea or vomiting.   Sronyx  0.1-20 MG-MCG tablet Generic drug: levonorgestrel-ethinyl estradiol Take 1 tablet by mouth daily.   vortioxetine  HBr 20 MG Tabs tablet Commonly known as:  TRINTELLIX  Take 1 tablet (20 mg total) by mouth daily. What changed:  medication strength how much to take Changed by: Fonda LABOR Latonya Nelon         Objective:   BP 116/75   Pulse 76   Ht 5' 4 (1.626 m)   Wt 226 lb (102.5 kg)   SpO2 100%   BMI 38.79 kg/m   Wt Readings from Last 3 Encounters:  12/27/23 226 lb (102.5 kg)  10/04/23 230 lb (104.3 kg)  07/03/23 217 lb (98.4 kg)    Physical Exam Physical Exam   CHEST: Lungs clear to auscultation bilaterally. CARDIOVASCULAR: Heart regular rate and rhythm. MUSCULOSKELETAL: No tenderness on palpation of the foot.         Assessment & Plan:   Problem List Items Addressed This Visit       Other   Anxiety and depression - Primary   Relevant Medications   vortioxetine  HBr (TRINTELLIX ) 20 MG TABS tablet   Depression, recurrent   Relevant Medications   vortioxetine  HBr (TRINTELLIX ) 20 MG TABS tablet   Other Visit Diagnoses       Plantar fasciitis              Anxiety disorder Anxiety disorder with no improvement on Trintellix  10 mg. No side effects except nausea and vomiting on an empty stomach. No suicidal ideation, but feelings of guilt and inadequacy present. Gene site testing discussed for treatment guidance. Discussed increasing Trintellix  dosage. - Increase Trintellix  dosage to maximum allowable dose. - Perform gene site testing to guide future treatment. - Schedule follow-up in four weeks to reassess.  Heel pain likely plantar fasciitis Heel pain consistent with plantar fasciitis, worsened by steel toe shoes and walking barefoot. Pain worse in the morning, slightly improves during the day. Current management includes stretching and rolling a frozen water bottle, providing some relief. Discussed avoiding barefoot walking and wearing supportive footwear.  - Advise against walking barefoot on hard surfaces. - Recommend wearing supportive footwear or Crocs at home. - Continue stretching exercises and rolling a  frozen water bottle under the foot.          Follow up plan: Return in about 4 weeks (around 01/24/2024), or if symptoms worsen or fail to improve, for Depression recheck.  Counseling provided for all of the vaccine components No orders of the defined types were placed in this encounter.   Fonda Levins, MD [facility] 12/27/2023, 1:21 PM

## 2024-01-15 ENCOUNTER — Ambulatory Visit: Admitting: Family Medicine

## 2024-02-11 ENCOUNTER — Other Ambulatory Visit: Payer: Self-pay | Admitting: Family Medicine

## 2024-02-11 DIAGNOSIS — Z0000 Encounter for general adult medical examination without abnormal findings: Secondary | ICD-10-CM

## 2024-03-13 ENCOUNTER — Ambulatory Visit: Admitting: Family Medicine

## 2024-04-18 ENCOUNTER — Ambulatory Visit: Admitting: Family Medicine
